# Patient Record
Sex: Male | Born: 1985
Health system: Southern US, Community
[De-identification: ages and names within clinical notes are randomized; demographics above are authoritative.]

## PROBLEM LIST (undated history)

## (undated) DIAGNOSIS — F419 Anxiety disorder, unspecified: Secondary | ICD-10-CM

## (undated) DIAGNOSIS — F410 Panic disorder [episodic paroxysmal anxiety] without agoraphobia: Secondary | ICD-10-CM

## (undated) DIAGNOSIS — Z8619 Personal history of other infectious and parasitic diseases: Secondary | ICD-10-CM

## (undated) DIAGNOSIS — K219 Gastro-esophageal reflux disease without esophagitis: Secondary | ICD-10-CM

## (undated) DIAGNOSIS — K589 Irritable bowel syndrome without diarrhea: Secondary | ICD-10-CM

## (undated) HISTORY — DX: Personal history of other infectious and parasitic diseases: Z86.19

## (undated) HISTORY — DX: Gastro-esophageal reflux disease without esophagitis: K21.9

---
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
## 2007-08-10 ENCOUNTER — Emergency Department (HOSPITAL_COMMUNITY): Admission: EM | Admit: 2007-08-10 | Discharge: 2007-08-10 | Payer: Self-pay | Admitting: Emergency Medicine

## 2011-09-03 LAB — DIFFERENTIAL
Lymphocytes Relative: 38
Lymphs Abs: 2.4
Neutro Abs: 3.2
Neutrophils Relative %: 52

## 2011-09-03 LAB — URINALYSIS, ROUTINE W REFLEX MICROSCOPIC
Nitrite: NEGATIVE
Specific Gravity, Urine: 1.013
Urobilinogen, UA: 0.2
pH: 6

## 2011-09-03 LAB — LIPASE, BLOOD: Lipase: 37

## 2011-09-03 LAB — COMPREHENSIVE METABOLIC PANEL
AST: 17
BUN: 20
CO2: 26
Calcium: 10
Creatinine, Ser: 1.08
GFR calc Af Amer: >60
GFR calc non Af Amer: >60
Glucose, Bld: 83

## 2011-09-03 LAB — CBC
HCT: 47
MCHC: 34.3
MCV: 85.9
RBC: 5.47

## 2013-11-21 ENCOUNTER — Encounter (HOSPITAL_BASED_OUTPATIENT_CLINIC_OR_DEPARTMENT_OTHER): Payer: Self-pay | Admitting: Emergency Medicine

## 2013-11-21 ENCOUNTER — Encounter: Payer: Self-pay | Admitting: Family Medicine

## 2013-11-21 ENCOUNTER — Emergency Department (HOSPITAL_BASED_OUTPATIENT_CLINIC_OR_DEPARTMENT_OTHER)
Admission: EM | Admit: 2013-11-21 | Discharge: 2013-11-21 | Disposition: A | Discharge status: Home / Self Care | Payer: BC Managed Care – PPO | Attending: Emergency Medicine | Admitting: Emergency Medicine

## 2013-11-21 DIAGNOSIS — R1011 Right upper quadrant pain: Secondary | ICD-10-CM | POA: Insufficient documentation

## 2013-11-21 DIAGNOSIS — K589 Irritable bowel syndrome without diarrhea: Secondary | ICD-10-CM | POA: Insufficient documentation

## 2013-11-21 DIAGNOSIS — R109 Unspecified abdominal pain: Secondary | ICD-10-CM

## 2013-11-21 DIAGNOSIS — Z79899 Other long term (current) drug therapy: Secondary | ICD-10-CM | POA: Insufficient documentation

## 2013-11-21 DIAGNOSIS — K219 Gastro-esophageal reflux disease without esophagitis: Secondary | ICD-10-CM | POA: Insufficient documentation

## 2013-11-21 HISTORY — DX: Gastro-esophageal reflux disease without esophagitis: K21.9

## 2013-11-21 HISTORY — DX: Irritable bowel syndrome, unspecified: K58.9

## 2013-11-21 LAB — POCT I-STAT, CHEM 8
BUN: 12 mg/dL (ref 6–23)
Creatinine, Ser: 1.3 mg/dL (ref 0.50–1.35)
Hemoglobin: 15.3 g/dL (ref 13.0–17.0)
Potassium: 4.3 mEq/L (ref 3.7–5.3)
Sodium: 141 mEq/L (ref 137–147)

## 2013-11-21 LAB — HEPATIC FUNCTION PANEL
AST: 13 U/L (ref 0–37)
Albumin: 4.1 g/dL (ref 3.5–5.2)

## 2013-11-21 MED ORDER — PANTOPRAZOLE SODIUM 40 MG PO TBEC
40.0000 mg | DELAYED_RELEASE_TABLET | Freq: Every day | ORAL | Status: DC
  Administered 2013-11-21: 40 mg via ORAL
  Filled 2013-11-21: qty 1

## 2013-11-21 MED ORDER — ONDANSETRON HCL 4 MG PO TABS: 4.0000 mg | ORAL_TABLET | Freq: Three times a day (TID) | ORAL | Status: DC | PRN

## 2013-11-21 MED ORDER — HYOSCYAMINE SULFATE 0.125 MG SL SUBL: 0.1250 mg | SUBLINGUAL_TABLET | SUBLINGUAL | Status: DC | PRN

## 2013-11-21 MED ORDER — PANTOPRAZOLE SODIUM 40 MG PO TBEC: 40.0000 mg | DELAYED_RELEASE_TABLET | Freq: Every day | ORAL | Status: DC

## 2013-11-21 NOTE — ED Notes (Addendum)
C/o n/v acid reflux-pt states he has been dealing with c/o "for years"-states he feels he needs different meds to control-has been followed by GI and has appt with new GI in [place] 12/19/13- last work up approx 3 weeks ago at [facility]

## 2013-11-21 NOTE — ED Provider Notes (Signed)
CSN: [id number]     Arrival date & time 11/21/13  1230 History   First MD Initiated Contact with Patient 11/21/13 1249     Chief Complaint  Patient presents with  . Nausea   (Consider location/radiation/quality/duration/timing/severity/associated sxs/prior Treatment) Patient is a 27 y.o. male presenting with abdominal pain. The history is provided by the patient. No language interpreter was used.  Abdominal Pain Associated symptoms: nausea   Associated symptoms: no chest pain, no chills, no diarrhea, no fever, no shortness of breath and no vomiting   Associated symptoms comment:  The patient states he has a history of 'significant' reflux disease and IBS, previously evaluated and managed by Dr. Vernell Barrier in [facility]. He has been on Reglan on a daily basis but denies any other medications currently or in the past. His symptoms are nausea, burning into the epigastrium and chest from reflux, diarrhea and abdominal cramping. His last exacerbation was 3 weeks ago when he was treated in the hospital with IV fluids and medications. Today he presents with nausea without vomiting, mild RUQ abdominal discomfort that has been persistent, mildly worse today, and no diarrhea. He denies fever at any time. He is following up where referred at [facility] in [place] with his initial appointment January 27th.    Past Medical History  Diagnosis Date  . GERD (gastroesophageal reflux disease)   . IBS (irritable bowel syndrome)    History reviewed. No pertinent past surgical history. No family history on file. History  Substance Use Topics  . Smoking status: Never Smoker   . Smokeless tobacco: Not on file  . Alcohol Use: No    Review of Systems  Constitutional: Negative for fever and chills.  Respiratory: Negative.  Negative for shortness of breath.   Cardiovascular: Negative.  Negative for chest pain.  Gastrointestinal: Positive for nausea and abdominal pain. Negative for vomiting and diarrhea.   Musculoskeletal: Negative.   Skin: Negative.  Negative for rash.  Neurological: Negative.     Allergies  Gluten meal  Home Medications   Current Outpatient Rx  Name  Route  Sig  Dispense  Refill  . dicyclomine (BENTYL) 10 MG capsule   Oral   Take 10 mg by mouth 2 (two) times daily.         . ondansetron (ZOFRAN) 4 MG tablet   Oral   Take 4 mg by mouth at bedtime.          BP 116/74  Pulse 76  Temp(Src) 97.9 F (36.6 C) (Oral)  Resp 16  Ht 5\' 6"  (1.676 m)  Wt 138 lb (62.596 kg)  BMI 22.28 kg/m2  SpO2 100% Physical Exam  Constitutional: He is oriented to person, place, and time. He appears well-developed and well-nourished.  HENT:  Head: Normocephalic.  Neck: Normal range of motion. Neck supple.  Cardiovascular: Normal rate and regular rhythm.   Pulmonary/Chest: Effort normal and breath sounds normal.  Abdominal: Soft. Bowel sounds are normal. There is tenderness. There is no rebound and no guarding.  Mild RUQ tenderness without hepatomegaly.  Musculoskeletal: Normal range of motion.  Neurological: He is alert and oriented to person, place, and time.  Skin: Skin is warm and dry. No rash noted.  Psychiatric: He has a normal mood and affect.    ED Course  Procedures (including critical care time) Labs Review Labs Reviewed  HEPATIC FUNCTION PANEL   Results for orders placed during the hospital encounter of 11/21/13  HEPATIC FUNCTION PANEL  Result Value Range   Total Protein 7.0  6.0 - 8.3 g/dL   Albumin 4.1  3.5 - 5.2 g/dL   AST 13  0 - 37 U/L   ALT 13  0 - 53 U/L   Alkaline Phosphatase 52  39 - 117 U/L   Total Bilirubin 1.7 (*) 0.3 - 1.2 mg/dL   Bilirubin, Direct 0.2  0.0 - 0.3 mg/dL   Indirect Bilirubin 1.5 (*) 0.3 - 0.9 mg/dL  POCT I-STAT, CHEM 8      Result Value Range   Sodium 141  137 - 147 mEq/L   Potassium 4.3  3.7 - 5.3 mEq/L   Chloride 102  96 - 112 mEq/L   BUN 12  6 - 23 mg/dL   Creatinine, Ser 4.09  0.50 - 1.35 mg/dL   Glucose, Bld  59 (*) 70 - 99 mg/dL   Calcium, Ion 8.11 (*) 1.12 - 1.23 mmol/L   TCO2 28  0 - 100 mmol/L   Hemoglobin 15.3  13.0 - 17.0 g/dL   HCT 91.4  78.2 - 95.6 %    Imaging Review No results found.  EKG Interpretation   None       MDM  No diagnosis found. 1. Abdominal pain  Protonix started for symptoms of reflux. Will switch Bentyl to levsin to see if it offers more relief than Bentyl, which he states is no longer effective for abdominal cramping. Continue Zofran for nausea. Keep scheduled appointment with [facility] later in January.    Arnoldo Hooker, PA-C 11/21/13 1659

## 2013-11-22 NOTE — ED Provider Notes (Signed)
Medical screening examination/treatment/procedure(s) were performed by non-physician practitioner and as supervising physician I was immediately available for consultation/collaboration.  EKG Interpretation   None         Charles B. Sheldon, MD 11/22/13 0704 

## 2013-11-24 ENCOUNTER — Other Ambulatory Visit: Payer: Self-pay | Admitting: General Practice

## 2013-12-19 DIAGNOSIS — K219 Gastro-esophageal reflux disease without esophagitis: Secondary | ICD-10-CM | POA: Insufficient documentation

## 2013-12-19 DIAGNOSIS — R109 Unspecified abdominal pain: Secondary | ICD-10-CM | POA: Insufficient documentation

## 2013-12-29 ENCOUNTER — Telehealth: Payer: Self-pay

## 2013-12-29 NOTE — Telephone Encounter (Signed)
Left message for call back. Identifiable.  Flu-DUE Tdap- 11/24/07 [facility]- 11/23/05-negative

## 2014-01-01 ENCOUNTER — Ambulatory Visit: Payer: Self-pay | Admitting: Family Medicine

## 2014-01-01 NOTE — Telephone Encounter (Signed)
Patient cancelled appt and rescheduled for 02/12/14 @ 0900.

## 2014-01-16 DIAGNOSIS — R110 Nausea: Secondary | ICD-10-CM | POA: Insufficient documentation

## 2014-02-09 ENCOUNTER — Telehealth: Payer: Self-pay

## 2014-02-09 NOTE — Telephone Encounter (Signed)
Left message for call back Identifiable  Flu- Td- 11/24/07 [facility]- 11/23/05-negative

## 2014-02-12 ENCOUNTER — Ambulatory Visit: Payer: Self-pay | Admitting: Family Medicine

## 2014-02-14 NOTE — Telephone Encounter (Signed)
Appointment was cancelled.

## 2014-04-13 ENCOUNTER — Other Ambulatory Visit: Payer: Self-pay | Admitting: General Practice

## 2014-04-13 MED ORDER — FLUCONAZOLE 150 MG PO TABS: ORAL_TABLET | ORAL | Status: DC

## 2014-06-22 ENCOUNTER — Other Ambulatory Visit: Payer: Self-pay | Admitting: General Practice

## 2014-06-22 MED ORDER — FLUCONAZOLE 150 MG PO TABS: ORAL_TABLET | ORAL | Status: DC

## 2014-07-04 ENCOUNTER — Other Ambulatory Visit: Payer: Self-pay | Admitting: General Practice

## 2014-07-04 ENCOUNTER — Ambulatory Visit (INDEPENDENT_AMBULATORY_CARE_PROVIDER_SITE_OTHER): Payer: BC Managed Care – PPO | Admitting: Family Medicine

## 2014-07-04 ENCOUNTER — Encounter: Payer: Self-pay | Admitting: Family Medicine

## 2014-07-04 VITALS — BP 112/78 | HR 70 | Temp 98.0°F | Resp 16 | Ht 65.5 in | Wt 137.4 lb

## 2014-07-04 DIAGNOSIS — F411 Generalized anxiety disorder: Secondary | ICD-10-CM | POA: Insufficient documentation

## 2014-07-04 DIAGNOSIS — B360 Pityriasis versicolor: Secondary | ICD-10-CM

## 2014-07-04 DIAGNOSIS — K219 Gastro-esophageal reflux disease without esophagitis: Secondary | ICD-10-CM

## 2014-07-04 MED ORDER — ALPRAZOLAM 0.5 MG PO TABS: 0.5000 mg | ORAL_TABLET | Freq: Two times a day (BID) | ORAL | Status: DC | PRN

## 2014-07-04 MED ORDER — PANTOPRAZOLE SODIUM 40 MG PO TBEC: 40.0000 mg | DELAYED_RELEASE_TABLET | Freq: Every day | ORAL | Status: DC

## 2014-07-04 MED ORDER — KETOCONAZOLE 2 % EX CREA: 1.0000 "application " | TOPICAL_CREAM | Freq: Every day | CUTANEOUS | Status: DC

## 2014-07-04 NOTE — Assessment & Plan Note (Signed)
New.  Pt was previously was prescribed Valium in October by GI.  Discussed w/ pt that benzo use prn is ok but that it should not be used regularly.  If pt is requiring regular use of xanax, will need daily controller med.

## 2014-07-04 NOTE — Progress Notes (Signed)
   Subjective:    Patient ID: Tanner Wallace, male    DOB: 22-Sep-1986, 28 y.o.   MRN: [id number]  HPI New to establish.  No regular PCP previously  Stomach issues- sxs started 'many years ago'.  Initially dx'd as IBS.  Developed into daily nausea, starting w/ waking in the AM, decreased appetite, vomiting 'pure stomach acid'.  Had lost considerable weight and was down to 110 lbs.  Was unable to work for 2-3 months as he had multiple tests done at [facility].  Now on Protonix.  Will still have intermittent 'bad days'.  Appetite has returned.  Pt has Zofran as needed for nausea- rarely taking.  Rarely requiring Levsin.  Tinea versicolor- triggered by sun exposure.  Was given Diflucan to take as needed- was told to take 1 tab weekly when present.  Pt had initially tried topical antifungal cream w/o improvement.   Review of Systems For ROS see HPI     Objective:   Physical Exam  Vitals reviewed. Constitutional: He is oriented to person, place, and time. He appears well-developed and well-nourished. No distress.  HENT:  Head: Normocephalic and atraumatic.  Eyes: Conjunctivae and EOM are normal. Pupils are equal, round, and reactive to light.  Neck: Normal range of motion. Neck supple. No thyromegaly present.  Cardiovascular: Normal rate, regular rhythm, normal heart sounds and intact distal pulses.   No murmur heard. Pulmonary/Chest: Effort normal and breath sounds normal. No respiratory distress.  Abdominal: Soft. Bowel sounds are normal. He exhibits no distension.  Musculoskeletal: He exhibits no edema.  Lymphadenopathy:    He has no cervical adenopathy.  Neurological: He is alert and oriented to person, place, and time. No cranial nerve deficit.  Skin: Skin is warm and dry.  Tinea versicolor on chest and upper arms  Psychiatric: He has a normal mood and affect. His behavior is normal.          Assessment & Plan:

## 2014-07-04 NOTE — Assessment & Plan Note (Signed)
New.  Pt taking Diflucan once weekly x3 weeks.  Will start topical antifungal to use along w/ oral tx to minimize use of antifungals long term.

## 2014-07-04 NOTE — Assessment & Plan Note (Signed)
New to provider, ongoing for pt.  Doing much better on Protonix.  Will continue daily.

## 2014-07-04 NOTE — Progress Notes (Signed)
Pre visit review using our clinic review tool, if applicable. No additional management support is needed unless otherwise documented below in the visit note. 

## 2014-07-04 NOTE — Patient Instructions (Signed)
Schedule your complete physical in 6 months Start applying the Ketoconazole cream daily x2 weeks in addition to the fluconazole Continue the Protonix daily Only use the Zofran/Levsin as needed If the anxiety/stress is really bad- use an alprazolam as needed.  This is only a rescue medication.  If you find yourself needing this regularly, we'll have to talk about changing to a daily controller medication Call with any questions or concerns Welcome!  We're glad to have you!

## 2014-09-03 ENCOUNTER — Telehealth: Payer: Self-pay | Admitting: General Practice

## 2014-09-03 MED ORDER — ALPRAZOLAM 0.5 MG PO TABS: 0.5000 mg | ORAL_TABLET | Freq: Two times a day (BID) | ORAL | Status: DC | PRN

## 2014-09-03 NOTE — Telephone Encounter (Signed)
Med filled faxed.  

## 2014-09-03 NOTE — Telephone Encounter (Signed)
Ok to refill 

## 2014-09-03 NOTE — Telephone Encounter (Signed)
Last OV 07-04-14 Alprazolam filled same day #30 with 0  Ok for refill, CPE scheduled for February.

## 2014-12-12 ENCOUNTER — Ambulatory Visit (INDEPENDENT_AMBULATORY_CARE_PROVIDER_SITE_OTHER): Payer: BLUE CROSS/BLUE SHIELD | Admitting: Family Medicine

## 2014-12-12 ENCOUNTER — Encounter: Payer: Self-pay | Admitting: Family Medicine

## 2014-12-12 VITALS — BP 116/80 | HR 69 | Temp 98.2°F | Resp 16 | Wt 147.4 lb

## 2014-12-12 DIAGNOSIS — B360 Pityriasis versicolor: Secondary | ICD-10-CM

## 2014-12-12 MED ORDER — FLUCONAZOLE 150 MG PO TABS: 300.0000 mg | ORAL_TABLET | ORAL | Status: DC

## 2014-12-12 NOTE — Progress Notes (Signed)
Pre visit review using our clinic review tool, if applicable. No additional management support is needed unless otherwise documented below in the visit note. 

## 2014-12-12 NOTE — Assessment & Plan Note (Signed)
Recurrent problem for pt.  Occuring in times of stress/heat/sweat.  No relief w/ topical tx.  Start oral anti-fungals.  Reviewed supportive care and red flags that should prompt return.  Pt expressed understanding and is in agreement w/ plan.

## 2014-12-12 NOTE — Patient Instructions (Signed)
Follow up as needed Start the Diflucan- 2 tabs at the same time (w/ food) x2 doses 1 week apart Call with any questions or concerns Hang in there!!!

## 2014-12-12 NOTE — Progress Notes (Signed)
   Subjective:    Patient ID: Orion ModestCory R Moncayo, male    DOB: 04-18-1986, 29 y.o.   MRN: [id number]  HPI Rash- pt reports sxs of similar.  Develops red spots on skin, typically on chest and shoulders, some on arms.  Typically appears w/ sweat and heat.  Itchy.  No oozing and drainage.  Has used Ketoconazole cream previously w/o relief.  Reports the only effective treatment last time was oral Diflucan.   Review of Systems For ROS see HPI     Objective:   Physical Exam  Constitutional: He is oriented to person, place, and time. He appears well-developed and well-nourished. No distress.  Neurological: He is alert and oriented to person, place, and time.  Skin: Skin is warm and dry. No rash (none present today which is frustrating for pt) noted. No erythema.  Psychiatric: He has a normal mood and affect. His behavior is normal. Thought content normal.  Vitals reviewed.         Assessment & Plan:

## 2015-01-03 ENCOUNTER — Telehealth: Payer: Self-pay | Admitting: *Deleted

## 2015-01-03 NOTE — Telephone Encounter (Signed)
Patient unavailable at time of Pre-Visit Call.  Health history up to date and patient declined flu vaccine at last visit.

## 2015-01-04 ENCOUNTER — Encounter: Payer: Self-pay | Admitting: Family Medicine

## 2015-01-04 ENCOUNTER — Ambulatory Visit (INDEPENDENT_AMBULATORY_CARE_PROVIDER_SITE_OTHER): Payer: BLUE CROSS/BLUE SHIELD | Admitting: Family Medicine

## 2015-01-04 VITALS — BP 118/80 | HR 103 | Temp 98.2°F | Resp 16 | Ht 66.0 in | Wt 150.0 lb

## 2015-01-04 DIAGNOSIS — Z0000 Encounter for general adult medical examination without abnormal findings: Secondary | ICD-10-CM | POA: Insufficient documentation

## 2015-01-04 LAB — HEPATIC FUNCTION PANEL
ALT: 15 U/L (ref 0–53)
AST: 14 U/L (ref 0–37)
Albumin: 4.3 g/dL (ref 3.5–5.2)
Alkaline Phosphatase: 51 U/L (ref 39–117)
Bilirubin, Direct: 0.2 mg/dL (ref 0.0–0.3)
Total Bilirubin: 1.2 mg/dL (ref 0.2–1.2)
Total Protein: 7 g/dL (ref 6.0–8.3)

## 2015-01-04 LAB — LIPID PANEL
Cholesterol: 149 mg/dL (ref 0–200)
HDL: 57.2 mg/dL
LDL Cholesterol: 82 mg/dL (ref 0–99)
NonHDL: 91.8
Total CHOL/HDL Ratio: 3
Triglycerides: 47 mg/dL (ref 0.0–149.0)
VLDL: 9.4 mg/dL (ref 0.0–40.0)

## 2015-01-04 LAB — BASIC METABOLIC PANEL
BUN: 17 mg/dL (ref 6–23)
CALCIUM: 9.4 mg/dL (ref 8.4–10.5)
CHLORIDE: 104 meq/L (ref 96–112)
CO2: 31 meq/L (ref 19–32)
CREATININE: 1.2 mg/dL (ref 0.40–1.50)
GFR: 77.25 mL/min (ref >60.00)
Glucose, Bld: 72 mg/dL (ref 70–99)
Potassium: 4.1 mEq/L (ref 3.5–5.1)
Sodium: 139 mEq/L (ref 135–145)

## 2015-01-04 LAB — CBC WITH DIFFERENTIAL/PLATELET
BASOS ABS: 0 10*3/uL (ref 0.0–0.1)
BASOS PCT: 0.4 % (ref 0.0–3.0)
Eosinophils Absolute: 0.1 10*3/uL (ref 0.0–0.7)
Eosinophils Relative: 1.7 % (ref 0.0–5.0)
HCT: 42.8 % (ref 39.0–52.0)
Hemoglobin: 14.7 g/dL (ref 13.0–17.0)
Lymphocytes Relative: 27.7 % (ref 12.0–46.0)
Lymphs Abs: 1.6 10*3/uL (ref 0.7–4.0)
MCHC: 34.3 g/dL (ref 30.0–36.0)
MCV: 89.3 fl (ref 78.0–100.0)
Monocytes Absolute: 0.5 10*3/uL (ref 0.1–1.0)
Monocytes Relative: 8.2 % (ref 3.0–12.0)
NEUTROS PCT: 62 % (ref 43.0–77.0)
Neutro Abs: 3.7 10*3/uL (ref 1.4–7.7)
PLATELETS: 199 10*3/uL (ref 150.0–400.0)
RBC: 4.79 Mil/uL (ref 4.22–5.81)
RDW: 13.7 % (ref 11.5–15.5)
WBC: 5.9 10*3/uL (ref 4.0–10.5)

## 2015-01-04 LAB — TSH: TSH: 0.88 u[IU]/mL (ref 0.35–4.50)

## 2015-01-04 NOTE — Patient Instructions (Signed)
Follow up in 1 year or as needed We'll notify you of your lab results and make any changes if needed Keep up the good work- you look great! Call with any questions or concerns Happy Early Birthday!

## 2015-01-04 NOTE — Progress Notes (Signed)
Pre visit review using our clinic review tool, if applicable. No additional management support is needed unless otherwise documented below in the visit note. 

## 2015-01-04 NOTE — Progress Notes (Signed)
   Subjective:    Patient ID: Tanner Wallace, male    DOB: 09/03/1986, 29 y.o.   MRN: [id number]  HPI CPE- no concerns today.   Review of Systems Patient reports no vision/hearing changes, anorexia, fever ,adenopathy, persistant/recurrent hoarseness, swallowing issues, chest pain, palpitations, edema, persistant/recurrent cough, hemoptysis, dyspnea (rest,exertional, paroxysmal nocturnal), gastrointestinal  bleeding (melena, rectal bleeding), abdominal pain, excessive heart burn, GU symptoms (dysuria, hematuria, voiding/incontinence issues) syncope, focal weakness, memory loss, numbness & tingling, skin/hair/nail changes, depression, anxiety, abnormal bruising/bleeding, musculoskeletal symptoms/signs.     Objective:   Physical Exam General Appearance:    Alert, cooperative, no distress, appears stated age  Head:    Normocephalic, without obvious abnormality, atraumatic  Eyes:    PERRL, conjunctiva/corneas clear, EOM's intact, fundi    benign, both eyes       Ears:    Normal TM's and external ear canals, both ears  Nose:   Nares normal, septum midline, mucosa normal, no drainage   or sinus tenderness  Throat:   Lips, mucosa, and tongue normal; teeth and gums normal  Neck:   Supple, symmetrical, trachea midline, no adenopathy;       thyroid:  No enlargement/tenderness/nodules  Back:     Symmetric, no curvature, ROM normal, no CVA tenderness  Lungs:     Clear to auscultation bilaterally, respirations unlabored  Chest wall:    No tenderness or deformity  Heart:    Regular rate and rhythm, S1 and S2 normal, no murmur, rub   or gallop  Abdomen:     Soft, non-tender, bowel sounds active all four quadrants,    no masses, no organomegaly  Genitalia:    Deferred at pt's request  Rectal:    Extremities:   Extremities normal, atraumatic, no cyanosis or edema  Pulses:   2+ and symmetric all extremities  Skin:   Skin color, texture, turgor normal, no rashes or lesions  Lymph nodes:   Cervical,  supraclavicular, and axillary nodes normal  Neurologic:   CNII-XII intact. Normal strength, sensation and reflexes      throughout          Assessment & Plan:

## 2015-01-04 NOTE — Assessment & Plan Note (Signed)
Pt's PE WNL.  Refused testicular exam.  Check labs.  Anticipatory guidance provided.

## 2015-01-07 ENCOUNTER — Encounter: Payer: Self-pay | Admitting: General Practice

## 2015-05-07 ENCOUNTER — Other Ambulatory Visit: Payer: Self-pay | Admitting: General Practice

## 2015-05-07 MED ORDER — KETOCONAZOLE 2 % EX CREA: 1.0000 "application " | TOPICAL_CREAM | Freq: Every day | CUTANEOUS | Status: DC

## 2015-06-03 ENCOUNTER — Encounter: Payer: Self-pay | Admitting: Family Medicine

## 2015-06-03 ENCOUNTER — Ambulatory Visit (INDEPENDENT_AMBULATORY_CARE_PROVIDER_SITE_OTHER): Payer: BLUE CROSS/BLUE SHIELD | Admitting: Family Medicine

## 2015-06-03 ENCOUNTER — Encounter: Payer: Self-pay | Admitting: General Practice

## 2015-06-03 VITALS — BP 120/82 | HR 73 | Temp 98.0°F | Resp 16 | Wt 156.4 lb

## 2015-06-03 DIAGNOSIS — K2990 Gastroduodenitis, unspecified, without bleeding: Secondary | ICD-10-CM | POA: Diagnosis not present

## 2015-06-03 DIAGNOSIS — B360 Pityriasis versicolor: Secondary | ICD-10-CM

## 2015-06-03 DIAGNOSIS — K2970 Gastritis, unspecified, without bleeding: Secondary | ICD-10-CM

## 2015-06-03 DIAGNOSIS — F411 Generalized anxiety disorder: Secondary | ICD-10-CM

## 2015-06-03 LAB — HEPATIC FUNCTION PANEL
ALBUMIN: 4.6 g/dL (ref 3.5–5.2)
ALK PHOS: 62 U/L (ref 39–117)
ALT: 20 U/L (ref 0–53)
AST: 16 U/L (ref 0–37)
Bilirubin, Direct: 0.3 mg/dL (ref 0.0–0.3)
Total Bilirubin: 2.4 mg/dL — ABNORMAL HIGH (ref 0.2–1.2)
Total Protein: 7.5 g/dL (ref 6.0–8.3)

## 2015-06-03 LAB — CBC WITH DIFFERENTIAL/PLATELET
Basophils Absolute: 0 10*3/uL (ref 0.0–0.1)
Basophils Relative: 0.3 % (ref 0.0–3.0)
EOS ABS: 0.1 10*3/uL (ref 0.0–0.7)
Eosinophils Relative: 1.4 % (ref 0.0–5.0)
HEMATOCRIT: 46 % (ref 39.0–52.0)
HEMOGLOBIN: 15.9 g/dL (ref 13.0–17.0)
LYMPHS PCT: 27.9 % (ref 12.0–46.0)
Lymphs Abs: 1.7 10*3/uL (ref 0.7–4.0)
MCHC: 34.5 g/dL (ref 30.0–36.0)
MCV: 90.1 fl (ref 78.0–100.0)
MONO ABS: 0.4 10*3/uL (ref 0.1–1.0)
MONOS PCT: 6.9 % (ref 3.0–12.0)
Neutro Abs: 3.8 10*3/uL (ref 1.4–7.7)
Neutrophils Relative %: 63.5 % (ref 43.0–77.0)
PLATELETS: 225 10*3/uL (ref 150.0–400.0)
RBC: 5.11 Mil/uL (ref 4.22–5.81)
RDW: 12.7 % (ref 11.5–15.5)
WBC: 6 10*3/uL (ref 4.0–10.5)

## 2015-06-03 LAB — BASIC METABOLIC PANEL
BUN: 16 mg/dL (ref 6–23)
CALCIUM: 9.7 mg/dL (ref 8.4–10.5)
CO2: 28 meq/L (ref 19–32)
Chloride: 101 mEq/L (ref 96–112)
Creatinine, Ser: 1.14 mg/dL (ref 0.40–1.50)
GFR: 81.71 mL/min (ref >60.00)
GLUCOSE: 83 mg/dL (ref 70–99)
Potassium: 4.1 mEq/L (ref 3.5–5.1)
Sodium: 136 mEq/L (ref 135–145)

## 2015-06-03 LAB — H. PYLORI ANTIBODY, IGG: H PYLORI IGG: NEGATIVE

## 2015-06-03 LAB — AMYLASE: Amylase: 52 U/L (ref 27–131)

## 2015-06-03 LAB — LIPASE: Lipase: 29 U/L (ref 11.0–59.0)

## 2015-06-03 MED ORDER — FLUCONAZOLE 150 MG PO TABS: ORAL_TABLET | ORAL | Status: DC

## 2015-06-03 MED ORDER — FLUOXETINE HCL 20 MG PO TABS: 20.0000 mg | ORAL_TABLET | Freq: Every day | ORAL | Status: AC

## 2015-06-03 MED ORDER — PANTOPRAZOLE SODIUM 40 MG PO TBEC: 40.0000 mg | DELAYED_RELEASE_TABLET | Freq: Two times a day (BID) | ORAL | Status: DC

## 2015-06-03 NOTE — Assessment & Plan Note (Signed)
Recurring problem for pt.  Resistant to topical tx.  Refill on Diflucan provided.

## 2015-06-03 NOTE — Assessment & Plan Note (Signed)
Deteriorated.  Pt is having high stress from work and family money issues.  Rarely using Alprazolam.  Needs daily controller medication to improve his sxs.  Pt is willing to attempt Prozac daily.  Will monitor closely for improvement.

## 2015-06-03 NOTE — Assessment & Plan Note (Signed)
New.  Pt continues to have daily AM nausea and some vomiting.  Now blood tinged.  Pt is refusing GI referral/endoscopy at this time.  Prefers to treat w/ BID PPI and dietary modifications.  Suspect that pt drinks more ETOH than he is reporting- cautioned him against this.  Cautioned him about risks of not seeing GI- including GI perforation/rupture, GI bleed.  Pt aware.  Will follow closely.

## 2015-06-03 NOTE — Patient Instructions (Signed)
Follow up in 1 month to recheck abdominal pain and stress We'll notify you of your lab results and make any changes if needed Start the Protonix twice daily x1 month and then daily LIMIT your alcohol intake as this is very irritating to the stomach lining Start the Prozac daily for stress Take the Diflucan as directed for rash If the abdominal pain/blood worsens- let me know so we can send you to GI Call with any questions or concerns Hang in there!

## 2015-06-03 NOTE — Progress Notes (Signed)
   Subjective:    Patient ID: Tanner Wallace, male    DOB: November 12, 1986, 29 y.o.   MRN: [id number]  HPI GERD- pt reports ongoing AM nausea but 'a couple of months ago' saw blood in his 'bile-acid' mixture.  Started OTC PPI w/ improvement for 'a couple of weeks'.  Last week again, noted blood when vomiting but 'more and more'.  Taking protonix 'only as needed'.  Also having pain in lower part of abdomen.  No blood in stool.  Denies dark, tarry stools.  Pain improves throughout the day.  Pain improves w/ eating.  Denies pain w/ ETOH.  Pt will drink 5-6 beers on Friday and Saturday nights.  Pt got to work at 12:30pm and left at 1:30pm due to abd pain.  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  On Wednesday 7/6 pt was unable to work b/c 'it was the worst pain of my life' and vomiting.  Tinea Versicolor- pt is again having rash, itching.  No relief w/ topical Nizoral.  Asking for refill on Diflucan   Review of Systems For ROS see HPI     Objective:   Physical Exam  Constitutional: He is oriented to person, place, and time. He appears well-developed and well-nourished. No distress.  HENT:  Head: Normocephalic and atraumatic.  Eyes: Conjunctivae and EOM are normal. Pupils are equal, round, and reactive to light.  Neck: Normal range of motion. Neck supple. No thyromegaly present.  Cardiovascular: Normal rate, regular rhythm, normal heart sounds and intact distal pulses.   No murmur heard. Pulmonary/Chest: Effort normal and breath sounds normal. No respiratory distress.  Abdominal: Soft. Bowel sounds are normal. He exhibits no distension.  Musculoskeletal: He exhibits no edema.  Lymphadenopathy:    He has no cervical adenopathy.  Neurological: He is alert and oriented to person, place, and time. No cranial nerve deficit.  Skin: Skin is warm and dry.  Psychiatric: He has a normal mood and affect. His behavior is normal.  Vitals reviewed.         Assessment &  Plan:

## 2015-06-03 NOTE — Progress Notes (Signed)
Pre visit review using our clinic review tool, if applicable. No additional management support is needed unless otherwise documented below in the visit note. 

## 2015-07-04 ENCOUNTER — Ambulatory Visit (INDEPENDENT_AMBULATORY_CARE_PROVIDER_SITE_OTHER): Payer: BLUE CROSS/BLUE SHIELD | Admitting: Family Medicine

## 2015-07-04 ENCOUNTER — Encounter: Payer: Self-pay | Admitting: Family Medicine

## 2015-07-04 VITALS — BP 122/80 | HR 70 | Temp 98.0°F | Resp 16 | Ht 66.0 in | Wt 158.0 lb

## 2015-07-04 DIAGNOSIS — F411 Generalized anxiety disorder: Secondary | ICD-10-CM

## 2015-07-04 DIAGNOSIS — K2990 Gastroduodenitis, unspecified, without bleeding: Secondary | ICD-10-CM | POA: Diagnosis not present

## 2015-07-04 DIAGNOSIS — K219 Gastro-esophageal reflux disease without esophagitis: Secondary | ICD-10-CM | POA: Diagnosis not present

## 2015-07-04 DIAGNOSIS — S39011A Strain of muscle, fascia and tendon of abdomen, initial encounter: Secondary | ICD-10-CM | POA: Diagnosis not present

## 2015-07-04 DIAGNOSIS — K2970 Gastritis, unspecified, without bleeding: Secondary | ICD-10-CM | POA: Diagnosis not present

## 2015-07-04 MED ORDER — TRAMADOL HCL 50 MG PO TABS
50.0000 mg | ORAL_TABLET | Freq: Three times a day (TID) | ORAL | Status: DC | PRN
Start: 2015-07-04 — End: 2016-06-02

## 2015-07-04 MED ORDER — CYCLOBENZAPRINE HCL 10 MG PO TABS: 10.0000 mg | ORAL_TABLET | Freq: Every day | ORAL | Status: AC

## 2015-07-04 MED ORDER — SUCRALFATE 1 G PO TABS: 1.0000 g | ORAL_TABLET | Freq: Three times a day (TID) | ORAL | Status: DC

## 2015-07-04 NOTE — Progress Notes (Signed)
Pre visit review using our clinic review tool, if applicable. No additional management support is needed unless otherwise documented below in the visit note. 

## 2015-07-04 NOTE — Assessment & Plan Note (Signed)
New.  Pt w/ an apparent R oblique strain.  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  Will start Tylenol for daytime pain, Ultram prn, and flexeril at night.  No NSAIDs due to severe gastritis.  Encouraged abdominal binder when working.  Reviewed supportive care and red flags that should prompt return.  Pt expressed understanding and is in agreement w/ plan.

## 2015-07-04 NOTE — Patient Instructions (Signed)
Follow up in 6 weeks to reassess GI issues Continue the Protonix twice daily ADD the Carafate prior to eating to protect your stomach Continue the Fluoxetine daily for anxiety- this is doing better! Try and limit twisting or lifting to improve your abdominal strain Use the flexeril prior to bed- will cause drowsiness Use the Tramadol as needed for severe pain Tylenol 2-3x/day as needed for abd strain Wear a binder to support the abs Alternate heat/ice for pain relief Call with any questions or concerns Hang in there!!!

## 2015-07-04 NOTE — Progress Notes (Signed)
   Subjective:    Patient ID: Tanner Wallace, male    DOB: 1986/08/04, 29 y.o.   MRN: [id number]  HPI Anxiety- started Prozac at last visit.  Pt reports anxiety is improving, feeling less wound up.  No longer shaking due to stress level.  Sleeping better.  GERD- pt reports he is no longer having bloody emesis in the AM.  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  No known injury.  Pt plays softball.  Denies bruising, swelling.  Pain w/ twisting or pulling motion.   Review of Systems For ROS see HPI     Objective:   Physical Exam  Constitutional: He is oriented to person, place, and time. He appears well-developed and well-nourished. No distress.  HENT:  Head: Normocephalic and atraumatic.  Pulmonary/Chest: Effort normal and breath sounds normal. No respiratory distress. He has no wheezes. He has no rales.  Abdominal: Soft. Bowel sounds are normal. He exhibits no distension. There is tenderness (TTP over R oblique). There is no rebound and no guarding.  Musculoskeletal: He exhibits tenderness (TTP over R oblique). He exhibits no edema.  Neurological: He is alert and oriented to person, place, and time.  Skin: Skin is warm and dry.  No bruising noted over R flank  Psychiatric:  Less anxious than previous  Vitals reviewed.         Assessment & Plan:

## 2015-07-04 NOTE — Assessment & Plan Note (Signed)
Improving since starting Protonix BID.  Reviewed lifestyle and dietary modifications.  Will follow.

## 2015-07-04 NOTE — Assessment & Plan Note (Signed)
Improving on Protonix twice daily.  Add Carafate to speed healing.  Pt declines GI referral at this time due to cost.  Will follow.

## 2015-07-04 NOTE — Assessment & Plan Note (Signed)
Improved since starting Prozac.  Will hold dose at current level and continue to follow, titrating prn.

## 2015-07-22 ENCOUNTER — Other Ambulatory Visit: Payer: Self-pay | Admitting: General Practice

## 2015-07-22 MED ORDER — FLUCONAZOLE 150 MG PO TABS: ORAL_TABLET | ORAL | Status: DC

## 2015-08-15 ENCOUNTER — Encounter: Payer: Self-pay | Admitting: Family Medicine

## 2015-08-15 ENCOUNTER — Ambulatory Visit (INDEPENDENT_AMBULATORY_CARE_PROVIDER_SITE_OTHER): Payer: BLUE CROSS/BLUE SHIELD | Admitting: Family Medicine

## 2015-08-15 VITALS — BP 120/78 | HR 61 | Temp 98.0°F | Resp 16 | Ht 66.0 in | Wt 165.1 lb

## 2015-08-15 DIAGNOSIS — F411 Generalized anxiety disorder: Secondary | ICD-10-CM

## 2015-08-15 DIAGNOSIS — K2990 Gastroduodenitis, unspecified, without bleeding: Secondary | ICD-10-CM

## 2015-08-15 DIAGNOSIS — K2970 Gastritis, unspecified, without bleeding: Secondary | ICD-10-CM | POA: Diagnosis not present

## 2015-08-15 NOTE — Assessment & Plan Note (Signed)
Improved.  Pt doing well on Prozac daily.  No med changes at this time.  Will continue to follow.

## 2015-08-15 NOTE — Progress Notes (Signed)
   Subjective:    Patient ID: Tanner Wallace, male    DOB: 1986-07-29, 29 y.o.   MRN: [id number]  HPI abd pain- started Carafate at last visit, pt reports sxs are much improved.  Still taking Protonix twice daily.  No longer vomiting in the AM.  No blood.  Anxiety- pt reports sxs are 'pretty good'.  'it's not nearly as bad as it was'.  No longer shaking.  Pt is not interested in increasing dose.   Review of Systems For ROS see HPI     Objective:   Physical Exam  Constitutional: He is oriented to person, place, and time. He appears well-developed and well-nourished. No distress.  HENT:  Head: Normocephalic and atraumatic.  Eyes: Conjunctivae and EOM are normal. Pupils are equal, round, and reactive to light.  Neck: Normal range of motion. Neck supple. No thyromegaly present.  Cardiovascular: Normal rate, regular rhythm, normal heart sounds and intact distal pulses.   No murmur heard. Pulmonary/Chest: Effort normal and breath sounds normal. No respiratory distress.  Abdominal: Soft. Bowel sounds are normal. He exhibits no distension.  Musculoskeletal: He exhibits no edema.  Lymphadenopathy:    He has no cervical adenopathy.  Neurological: He is alert and oriented to person, place, and time. No cranial nerve deficit.  Skin: Skin is warm and dry.  Psychiatric: He has a normal mood and affect. His behavior is normal.  Vitals reviewed.         Assessment & Plan:

## 2015-08-15 NOTE — Assessment & Plan Note (Signed)
Improving.  Pt reports sxs are much better since starting Carafate 5 weeks ago.  At this time, will have him stop Carafate and see if we can maintain on the Protonix twice daily.  If sxs return, will need GI referral.  Reviewed supportive care and red flags that should prompt return.  Pt expressed understanding and is in agreement w/ plan.

## 2015-08-15 NOTE — Patient Instructions (Signed)
Follow up by phone/MyChart in 1-2 weeks and let me know how the stomach is doing STOP the carafate Continue the Protonix twice daily Continue the Fluoxetine (Prozac) once daily Call with any questions or concerns Happy Fall!!!

## 2015-08-15 NOTE — Progress Notes (Signed)
Pre visit review using our clinic review tool, if applicable. No additional management support is needed unless otherwise documented below in the visit note. 

## 2016-01-06 ENCOUNTER — Ambulatory Visit (INDEPENDENT_AMBULATORY_CARE_PROVIDER_SITE_OTHER): Payer: BLUE CROSS/BLUE SHIELD | Admitting: Family Medicine

## 2016-01-06 ENCOUNTER — Telehealth: Payer: Self-pay | Admitting: *Deleted

## 2016-01-06 ENCOUNTER — Encounter: Payer: Self-pay | Admitting: Family Medicine

## 2016-01-06 VITALS — BP 122/80 | HR 74 | Temp 97.9°F | Ht 66.0 in | Wt 172.2 lb

## 2016-01-06 DIAGNOSIS — R110 Nausea: Secondary | ICD-10-CM

## 2016-01-06 DIAGNOSIS — R5383 Other fatigue: Secondary | ICD-10-CM | POA: Diagnosis not present

## 2016-01-06 DIAGNOSIS — K920 Hematemesis: Secondary | ICD-10-CM | POA: Diagnosis not present

## 2016-01-06 DIAGNOSIS — K2990 Gastroduodenitis, unspecified, without bleeding: Secondary | ICD-10-CM | POA: Diagnosis not present

## 2016-01-06 DIAGNOSIS — K2970 Gastritis, unspecified, without bleeding: Secondary | ICD-10-CM

## 2016-01-06 LAB — HEPATIC FUNCTION PANEL
ALBUMIN: 4.5 g/dL (ref 3.5–5.2)
ALT: 24 U/L (ref 0–53)
AST: 16 U/L (ref 0–37)
Alkaline Phosphatase: 57 U/L (ref 39–117)
BILIRUBIN DIRECT: 0.1 mg/dL (ref 0.0–0.3)
TOTAL PROTEIN: 7.3 g/dL (ref 6.0–8.3)
Total Bilirubin: 1 mg/dL (ref 0.2–1.2)

## 2016-01-06 LAB — CBC WITH DIFFERENTIAL/PLATELET
BASOS ABS: 0 10*3/uL (ref 0.0–0.1)
Basophils Relative: 0.4 % (ref 0.0–3.0)
EOS ABS: 0.1 10*3/uL (ref 0.0–0.7)
Eosinophils Relative: 2.4 % (ref 0.0–5.0)
HEMATOCRIT: 44.3 % (ref 39.0–52.0)
HEMOGLOBIN: 15 g/dL (ref 13.0–17.0)
LYMPHS PCT: 33 % (ref 12.0–46.0)
Lymphs Abs: 2 10*3/uL (ref 0.7–4.0)
MCHC: 33.8 g/dL (ref 30.0–36.0)
MCV: 89.3 fl (ref 78.0–100.0)
MONO ABS: 0.5 10*3/uL (ref 0.1–1.0)
Monocytes Relative: 8.4 % (ref 3.0–12.0)
Neutro Abs: 3.4 10*3/uL (ref 1.4–7.7)
Neutrophils Relative %: 55.8 % (ref 43.0–77.0)
Platelets: 220 10*3/uL (ref 150.0–400.0)
RBC: 4.96 Mil/uL (ref 4.22–5.81)
RDW: 13.1 % (ref 11.5–15.5)
WBC: 6.2 10*3/uL (ref 4.0–10.5)

## 2016-01-06 LAB — H. PYLORI ANTIBODY, IGG: H PYLORI IGG: NEGATIVE

## 2016-01-06 LAB — BASIC METABOLIC PANEL
BUN: 18 mg/dL (ref 6–23)
CHLORIDE: 105 meq/L (ref 96–112)
CO2: 30 mEq/L (ref 19–32)
Calcium: 9.7 mg/dL (ref 8.4–10.5)
Creatinine, Ser: 1.21 mg/dL (ref 0.40–1.50)
GFR: 75.94 mL/min (ref >60.00)
Glucose, Bld: 82 mg/dL (ref 70–99)
POTASSIUM: 4 meq/L (ref 3.5–5.1)
SODIUM: 140 meq/L (ref 135–145)

## 2016-01-06 LAB — TSH: TSH: 1.89 u[IU]/mL (ref 0.35–4.50)

## 2016-01-06 LAB — B12 AND FOLATE PANEL
FOLATE: 6.9 ng/mL (ref >5.9)
VITAMIN B 12: 305 pg/mL (ref 211–911)

## 2016-01-06 MED ORDER — TRAZODONE HCL 50 MG PO TABS
25.0000 mg | ORAL_TABLET | Freq: Every evening | ORAL | Status: DC | PRN
Start: 2016-01-06 — End: 2016-06-02

## 2016-01-06 MED ORDER — SUCRALFATE 1 GM/10ML PO SUSP: 1.0000 g | Freq: Three times a day (TID) | ORAL | Status: DC

## 2016-01-06 NOTE — Assessment & Plan Note (Signed)
New.  Pt reports having very low energy and difficulty getting going in the AM.  This could directly relate to his poor sleep but must also r/o anemia is setting of hematemesis, thyroid abnormality, or electrolyte disturbance.  Will check labs and correct abnormalities if they arise.

## 2016-01-06 NOTE — Progress Notes (Signed)
   Subjective:    Patient ID: Tanner Wallace, male    DOB: 06/16/1986, 30 y.o.   MRN: [id number]  HPI Gastritis- recurring problem for pt.  Reports that sxs were well controlled until ~1 month ago when he started vomiting again each morning.  2 weeks ago noticed blood in vomiting.  Pt reports increased AM fatigue after vomiting- 'takes 1/2 a day to get my energy back'.  Pt is on daily PPI and carafate twice daily (lunch and dinner).  Insomnia- pt reports he has always had trouble falling asleep but lately he is having difficulty staying asleep.  Asking for medication to help with this.   Review of Systems For ROS see HPI     Objective:   Physical Exam  Constitutional: He is oriented to person, place, and time. He appears well-developed and well-nourished. No distress.  HENT:  Head: Normocephalic and atraumatic.  Eyes: Conjunctivae and EOM are normal. Pupils are equal, round, and reactive to light.  Cardiovascular: Normal rate, regular rhythm and normal heart sounds.   Pulmonary/Chest: Breath sounds normal. No respiratory distress. He has no wheezes. He has no rales.  Abdominal: Soft. Bowel sounds are normal. He exhibits no distension. There is no tenderness. There is no rebound.  Neurological: He is alert and oriented to person, place, and time.  Skin: Skin is warm and dry.  Psychiatric: He has a normal mood and affect. His behavior is normal. Thought content normal.  Vitals reviewed.         Assessment & Plan:

## 2016-01-06 NOTE — Assessment & Plan Note (Signed)
New.  Suspect that this is due to pt's gastritis/esophagitis but strongly encouraged him to see GI.  Pt declines.  Reviewed the risks- including major GI bleed- but pt is not interested at this time.  Prefers medical management.  Will follow closely.

## 2016-01-06 NOTE — Progress Notes (Signed)
Pre visit review using our clinic review tool, if applicable. No additional management support is needed unless otherwise documented below in the visit note. 

## 2016-01-06 NOTE — Telephone Encounter (Signed)
Received PA for Carafate from [facility] 02/13

## 2016-01-06 NOTE — Assessment & Plan Note (Signed)
Deteriorated.  Pt is again having AM vomiting and now there is blood present.  Stressed need for GI referral- pt declines at this time.  Prefers to double PPI and switch to liquid carafate.  Check H pylori and tx prn.  If no improvement in vomiting or hematemesis pt will need GI.  Pt expressed understanding and is in agreement w/ plan.

## 2016-01-06 NOTE — Patient Instructions (Signed)
Follow up by phone in 2 weeks to let me know about the vomiting/bleeding Increase the Protonix to twice daily to decrease acid production Continue the Carafate but increase to 3x/day- lunch, dinner, before bed Avoid spicy, acidic, caffeine, alcohol- this will worsen stomach upset Call with any questions or concerns- particularly if worsening or not improving b/c we will need GI referral Hang in there!!!

## 2016-01-07 ENCOUNTER — Telehealth: Payer: Self-pay | Admitting: Behavioral Health

## 2016-01-07 NOTE — Telephone Encounter (Signed)
Pre-Visit Call completed with patient and chart updated.   Pre-Visit Info documented in Specialty Comments under SnapShot.    

## 2016-01-08 ENCOUNTER — Encounter: Payer: Self-pay | Admitting: Family Medicine

## 2016-01-08 ENCOUNTER — Ambulatory Visit (INDEPENDENT_AMBULATORY_CARE_PROVIDER_SITE_OTHER): Payer: BLUE CROSS/BLUE SHIELD | Admitting: Family Medicine

## 2016-01-08 VITALS — BP 120/84 | HR 65 | Temp 97.8°F | Ht 66.0 in | Wt 172.6 lb

## 2016-01-08 DIAGNOSIS — Z0000 Encounter for general adult medical examination without abnormal findings: Secondary | ICD-10-CM

## 2016-01-08 MED ORDER — RANITIDINE HCL 300 MG PO TABS: 300.0000 mg | ORAL_TABLET | Freq: Every day | ORAL | Status: DC

## 2016-01-08 NOTE — Progress Notes (Signed)
Pre visit review using our clinic review tool, if applicable. No additional management support is needed unless otherwise documented below in the visit note. 

## 2016-01-08 NOTE — Progress Notes (Signed)
   Subjective:    Patient ID: Tanner Wallace, male    DOB: 09/12/1986, 30 y.o.   MRN: [id number]  HPI CPE- no new concerns today.   Review of Systems Patient reports no vision/hearing changes, anorexia, fever ,adenopathy, persistant/recurrent hoarseness, swallowing issues, chest pain, palpitations, edema, persistant/recurrent cough, hemoptysis, dyspnea (rest,exertional, paroxysmal nocturnal), gastrointestinal  bleeding (melena, rectal bleeding), GU symptoms (dysuria, hematuria, voiding/incontinence issues) syncope, focal weakness, memory loss, numbness & tingling, skin/hair/nail changes, depression, anxiety, abnormal bruising, musculoskeletal symptoms/signs.     Objective:   Physical Exam General Appearance:    Alert, cooperative, no distress, appears stated age  Head:    Normocephalic, without obvious abnormality, atraumatic  Eyes:    PERRL, conjunctiva/corneas clear, EOM's intact, fundi    benign, both eyes       Ears:    Normal TM's and external ear canals, both ears  Nose:   Nares normal, septum midline, mucosa normal, no drainage   or sinus tenderness  Throat:   Lips, mucosa, and tongue normal; teeth and gums normal  Neck:   Supple, symmetrical, trachea midline, no adenopathy;       thyroid:  No enlargement/tenderness/nodules  Back:     Symmetric, no curvature, ROM normal, no CVA tenderness  Lungs:     Clear to auscultation bilaterally, respirations unlabored  Chest wall:    No tenderness or deformity  Heart:    Regular rate and rhythm, S1 and S2 normal, no murmur, rub   or gallop  Abdomen:     Soft, non-tender, bowel sounds active all four quadrants,    no masses, no organomegaly  Genitalia:    Deferred at pt's request  Rectal:    Extremities:   Extremities normal, atraumatic, no cyanosis or edema  Pulses:   2+ and symmetric all extremities  Skin:   Skin color, texture, turgor normal, no rashes or lesions  Lymph nodes:   Cervical, supraclavicular, and axillary nodes normal    Neurologic:   CNII-XII intact. Normal strength, sensation and reflexes      throughout          Assessment & Plan:

## 2016-01-08 NOTE — Patient Instructions (Signed)
Follow up by phone or MyChart in 2 weeks to let me know how you're feeling Your labs from the other day look great!  No changes at this time Continue the protonix twice daily Add the Ranitidine nightly Continue the Carafate Call with any questions or concerns Hang in there!!

## 2016-01-08 NOTE — Assessment & Plan Note (Signed)
Pt's PE WNL.  Labs done 2 days ago- reviewed w/ pt.  No changes at this time.  Discussed importance of healthy diet and regular exercise.  Will follow.

## 2016-01-13 ENCOUNTER — Telehealth: Payer: Self-pay | Admitting: General Practice

## 2016-01-13 NOTE — Telephone Encounter (Signed)
PA for carafate began today. PA approved Effective from 01/13/2016 through 11/22/2038.

## 2016-01-13 NOTE — Telephone Encounter (Signed)
PA began for carafate with covermymeds

## 2016-03-11 ENCOUNTER — Other Ambulatory Visit: Payer: Self-pay | Admitting: General Practice

## 2016-03-11 MED ORDER — FLUCONAZOLE 150 MG PO TABS: ORAL_TABLET | ORAL | Status: DC

## 2016-06-02 ENCOUNTER — Encounter: Payer: Self-pay | Admitting: Physician Assistant

## 2016-06-02 ENCOUNTER — Ambulatory Visit (INDEPENDENT_AMBULATORY_CARE_PROVIDER_SITE_OTHER): Payer: BLUE CROSS/BLUE SHIELD | Admitting: Physician Assistant

## 2016-06-02 VITALS — BP 114/68 | HR 72 | Temp 98.2°F | Resp 16 | Ht 66.0 in | Wt 172.0 lb

## 2016-06-02 DIAGNOSIS — K219 Gastro-esophageal reflux disease without esophagitis: Secondary | ICD-10-CM

## 2016-06-02 MED ORDER — DICYCLOMINE HCL 10 MG PO CAPS
10.0000 mg | ORAL_CAPSULE | Freq: Three times a day (TID) | ORAL | Status: DC
Start: 2016-06-02 — End: 2022-01-31

## 2016-06-02 MED ORDER — DEXLANSOPRAZOLE 60 MG PO CPDR: 60.0000 mg | DELAYED_RELEASE_CAPSULE | Freq: Every day | ORAL | Status: DC

## 2016-06-02 MED ORDER — TRAZODONE HCL 50 MG PO TABS: 25.0000 mg | ORAL_TABLET | Freq: Every evening | ORAL | Status: AC | PRN

## 2016-06-02 NOTE — Patient Instructions (Signed)
Please stop the Protonix and Start the Dexilant 60 mg daily. Continue the Ranitidine and begin a daily probiotic (I recommend Align). Eat a bland diet. Limit alcohol intake. Avoid late night eating.  Restart the Trazodone nightly for sleep and to help with stress levels. This should also help with reflux.  If symptoms not improving we will need to get you back in with Gastroenterology.

## 2016-06-03 ENCOUNTER — Telehealth: Payer: Self-pay | Admitting: *Deleted

## 2016-06-03 MED FILL — DEXILANT DR 60 MG CAPSULE: 60 | 30 days supply | Qty: 30 | Fill #0

## 2016-06-03 NOTE — Telephone Encounter (Signed)
PA initiated on [email or web address] Key: [id number] - Rx #: [id number]  Awaiting determination

## 2016-06-05 NOTE — Telephone Encounter (Signed)
PA approved effective from 06/03/2016 through 11/22/2038

## 2016-06-07 NOTE — Progress Notes (Signed)
    Patient presents to clinic today c/o continued significant acid reflux symptoms including heart burn and indigestion. Has significant history of this, trying several medications in the past that have been sub-therapeutic. Is currently on Protonix 40 mg BID. Endorses this medication worked for a while but is no longer controlling symptoms. Denies chest pain, palpitations or SOB. Is affecting his sleep at night. Previousy followed by GI. No current follow-up.    Past Medical History  Diagnosis Date  . GERD (gastroesophageal reflux disease)   . IBS (irritable bowel syndrome)   . History of chicken pox   . Acid reflux     Current Outpatient Prescriptions on File Prior to Visit  Medication Sig Dispense Refill  . ALPRAZolam (XANAX) 0.5 MG tablet Take 1 tablet (0.5 mg total) by mouth 2 (two) times daily as needed for anxiety. 30 tablet 1  . cyclobenzaprine (FLEXERIL) 10 MG tablet Take 1 tablet (10 mg total) by mouth at bedtime. 30 tablet 0  . FLUoxetine (PROZAC) 20 MG tablet Take 1 tablet (20 mg total) by mouth daily. 30 tablet 3  . ranitidine (ZANTAC) 300 MG tablet Take 1 tablet (300 mg total) by mouth at bedtime. 30 tablet 6  . sucralfate (CARAFATE) 1 GM/10ML suspension Take 10 mLs (1 g total) by mouth 4 (four) times daily -  with meals and at bedtime. (Patient taking differently: Take 1 g by mouth 4 (four) times daily as needed. ) 420 mL 3   No current facility-administered medications on file prior to visit.    No Known Allergies  No family history on file.  Social History   Social History  . Marital Status: Single    Spouse Name: N/A  . Number of Children: N/A  . Years of Education: N/A   Social History Main Topics  . Smoking status: Never Smoker   . Smokeless tobacco: Never Used  . Alcohol Use: Yes  . Drug Use: No  . Sexual Activity: Yes   Other Topics Concern  . None   Social History Narrative    Review of Systems - See HPI.  All other ROS are negative.  BP  114/68 mmHg  Pulse 72  Temp(Src) 98.2 F (36.8 C) (Oral)  Resp 16  Ht 5\' 6"  (1.676 m)  Wt 172 lb (78.019 kg)  BMI 27.77 kg/m2  SpO2 99%  Physical Exam  Constitutional: He is oriented to person, place, and time and well-developed, well-nourished, and in no distress.  HENT:  Head: Normocephalic and atraumatic.  Eyes: Conjunctivae are normal.  Neck: Neck supple.  Cardiovascular: Normal rate, regular rhythm, normal heart sounds and intact distal pulses.   Pulmonary/Chest: Effort normal and breath sounds normal. No respiratory distress. He has no wheezes. He has no rales. He exhibits no tenderness.  Abdominal: Soft. Bowel sounds are normal. He exhibits no distension and no mass. There is no tenderness. There is no rebound and no guarding.  Neurological: He is alert and oriented to person, place, and time.  Skin: Skin is warm and dry. No rash noted.  Psychiatric: Affect normal.  Vitals reviewed.  Assessment/Plan: 1. Gastroesophageal reflux disease, esophagitis presence not specified Will change Protonix for trial of Dexilant. Continue Ranitidine as directed. Supportive measures and GERD diet discussed. Patient to restart Trazodone for sleep and nighttime GERD symptoms. FU with PCP as scheduled. Recommend repeat assessment with GI.    Piedad ClimesMartin, Chloee Tena Cody, PA-C

## 2016-06-08 ENCOUNTER — Other Ambulatory Visit: Payer: Self-pay | Admitting: Family Medicine

## 2016-06-08 NOTE — Telephone Encounter (Signed)
Med denied, pt was changed to dexilant.

## 2016-10-14 ENCOUNTER — Ambulatory Visit (INDEPENDENT_AMBULATORY_CARE_PROVIDER_SITE_OTHER): Payer: BLUE CROSS/BLUE SHIELD | Admitting: Physician Assistant

## 2016-10-14 ENCOUNTER — Encounter: Payer: Self-pay | Admitting: Physician Assistant

## 2016-10-14 VITALS — BP 114/78 | HR 70 | Temp 98.5°F | Resp 16 | Ht 66.0 in | Wt 170.0 lb

## 2016-10-14 DIAGNOSIS — M2550 Pain in unspecified joint: Secondary | ICD-10-CM

## 2016-10-14 DIAGNOSIS — R5383 Other fatigue: Secondary | ICD-10-CM | POA: Diagnosis not present

## 2016-10-14 DIAGNOSIS — B360 Pityriasis versicolor: Secondary | ICD-10-CM

## 2016-10-14 DIAGNOSIS — M25561 Pain in right knee: Secondary | ICD-10-CM | POA: Diagnosis not present

## 2016-10-14 DIAGNOSIS — K219 Gastro-esophageal reflux disease without esophagitis: Secondary | ICD-10-CM

## 2016-10-14 DIAGNOSIS — G8929 Other chronic pain: Secondary | ICD-10-CM

## 2016-10-14 DIAGNOSIS — M25562 Pain in left knee: Secondary | ICD-10-CM

## 2016-10-14 LAB — BASIC METABOLIC PANEL
BUN: 17 mg/dL (ref 6–23)
CHLORIDE: 101 meq/L (ref 96–112)
CO2: 29 mEq/L (ref 19–32)
CREATININE: 1.18 mg/dL (ref 0.40–1.50)
Calcium: 9.9 mg/dL (ref 8.4–10.5)
GFR: 77.75 mL/min (ref >60.00)
Glucose, Bld: 89 mg/dL (ref 70–99)
POTASSIUM: 4.6 meq/L (ref 3.5–5.1)
SODIUM: 139 meq/L (ref 135–145)

## 2016-10-14 LAB — CBC
HCT: 45.5 % (ref 39.0–52.0)
Hemoglobin: 15.7 g/dL (ref 13.0–17.0)
MCHC: 34.5 g/dL (ref 30.0–36.0)
MCV: 86.8 fl (ref 78.0–100.0)
Platelets: 242 10*3/uL (ref 150.0–400.0)
RBC: 5.23 Mil/uL (ref 4.22–5.81)
RDW: 13.4 % (ref 11.5–15.5)
WBC: 6.8 10*3/uL (ref 4.0–10.5)

## 2016-10-14 LAB — SEDIMENTATION RATE: SED RATE: 3 mm/h (ref 0–15)

## 2016-10-14 LAB — TSH: TSH: 0.91 u[IU]/mL (ref 0.35–4.50)

## 2016-10-14 MED ORDER — FAMOTIDINE 40 MG PO TABS: 40.0000 mg | ORAL_TABLET | Freq: Every day | ORAL | 0 refills | Status: DC

## 2016-10-14 MED ORDER — CELECOXIB 100 MG PO CAPS: 100.0000 mg | ORAL_CAPSULE | Freq: Every day | ORAL | 0 refills | Status: DC

## 2016-10-14 MED ORDER — FLUCONAZOLE 150 MG PO TABS: ORAL_TABLET | ORAL | 2 refills | Status: DC

## 2016-10-14 NOTE — Progress Notes (Signed)
Patient presents to clinic today c/o chronic, intermittent knee pain x 1 year. Endorses flare ups over the past week. R knee worse than L knee. Endorses kneeling constantly at work. Pain is with kneeling and rising to standing position from seated position. Notes some occasional R knee swelling. Denies numbness, tingling, weakness or bruising. Denies buckling of knees.   Patient also with history of significant GERD, previously followed by GI. Has been on numerous medications, currently on Dexilant 60 mg and Zantac 300 mg. Notes breakthrough symptoms at night regardless of diet. Is not currently seeing a GI specialist. Would like to discuss options. Denies abdominal pain or change to bowel habits.   Patient also endorses aching of most joints noted some mornings, especially on days after he works. Denies snoring or history of apneic episodes. Notes mattress is very old and may be contributing. Denies stiffness or aching of hands, mainly large joints (shoulders, knees, hips). Denies family history of RA. Endorses playing sports when he was younger but mostly non-contact (baseball).   Patient also endorses history of yeast rash of skin. Chart reviewed revealing + history of tinea versicolor. Patient states he has had a recurrence of the hypopigmented areas with some itching when he gets warm or after showers. Would like assessment to determine restarting treatment.   Past Medical History:  Diagnosis Date  . Acid reflux   . GERD (gastroesophageal reflux disease)   . History of chicken pox   . IBS (irritable bowel syndrome)     Current Outpatient Prescriptions on File Prior to Visit  Medication Sig Dispense Refill  . ALPRAZolam (XANAX) 0.5 MG tablet Take 1 tablet (0.5 mg total) by mouth 2 (two) times daily as needed for anxiety. 30 tablet 1  . cyclobenzaprine (FLEXERIL) 10 MG tablet Take 1 tablet (10 mg total) by mouth at bedtime. 30 tablet 0  . dexlansoprazole (DEXILANT) 60 MG capsule Take 1  capsule (60 mg total) by mouth daily. 30 capsule 1  . dicyclomine (BENTYL) 10 MG capsule Take 1 capsule (10 mg total) by mouth 3 (three) times daily before meals. 90 capsule 0  . fluconazole (DIFLUCAN) 150 MG tablet Take 150 mg by mouth as needed.    Marland Kitchen FLUoxetine (PROZAC) 20 MG tablet Take 1 tablet (20 mg total) by mouth daily. 30 tablet 3  . ranitidine (ZANTAC) 300 MG tablet Take 1 tablet (300 mg total) by mouth at bedtime. 30 tablet 6  . traZODone (DESYREL) 50 MG tablet Take 0.5-1 tablets (25-50 mg total) by mouth at bedtime as needed for sleep. 30 tablet 1   No current facility-administered medications on file prior to visit.     No Known Allergies  History reviewed. No pertinent family history.  Social History   Social History  . Marital status: Single    Spouse name: N/A  . Number of children: N/A  . Years of education: N/A   Social History Main Topics  . Smoking status: Never Smoker  . Smokeless tobacco: Never Used  . Alcohol use Yes  . Drug use: No  . Sexual activity: Yes   Other Topics Concern  . None   Social History Narrative  . None   Review of Systems - See HPI.  All other ROS are negative.  BP 114/78   Pulse 70   Temp 98.5 F (36.9 C)   Resp 16   Ht '5\' 6"'  (1.676 m)   Wt 170 lb (77.1 kg)   SpO2 98%   BMI  27.44 kg/m   Physical Exam  Constitutional: He is oriented to person, place, and time and well-developed, well-nourished, and in no distress.  HENT:  Head: Normocephalic and atraumatic.  Eyes: Conjunctivae are normal.  Neck: Neck supple.  Cardiovascular: Normal rate, regular rhythm, normal heart sounds and intact distal pulses.   Pulmonary/Chest: Effort normal and breath sounds normal. No respiratory distress. He has no wheezes. He has no rales. He exhibits no tenderness.  Musculoskeletal:       Right knee: He exhibits normal range of motion, no swelling, normal alignment, no LCL laxity, normal patellar mobility, normal meniscus and no MCL laxity.  No tenderness found.       Left knee: He exhibits normal range of motion, no swelling, normal alignment, no LCL laxity, normal patellar mobility, normal meniscus and no MCL laxity. No tenderness found.  Neurological: He is alert and oriented to person, place, and time.  Skin: Skin is warm and dry.     Psychiatric: Affect normal.  Vitals reviewed.  Assessment/Plan: 1. Fatigue, unspecified type Seems to correspond with workload. No mention of apneic episodes on interview. Body mass index is 27.44 kg/m. BP stable. Will check basic labs today to start with. Discussed well-balanced diet, exercise. If labs unremarkable, will begin further workup.   - CBC - TSH - Sed Rate (ESR) - Basic metabolic panel  2. Arthralgia of multiple joints Feel mattress and sleep positioning a big contributor. Aches are only on waking in the morning and resolve once he starts his day. Will check sed rate today. If elevated will obtain further workup for RA and other autoimmune causes of arthralgias.  - CBC - Sed Rate (ESR)  3. Tinea versicolor Rx Diflucan once weekly x 3 weeks. Supportive measures reviewed.  4. Chronic pain of both knees Exam unremarkable. No sign of acute injury. Suspect tendon inflammation giving location of pain and constant kneeling at work. Will attempt rial or anti-inflammatory and supportive measures. If not improving, will need imaging and Sports Medicine assessment.   5. GERD Uncontrolled despite trial or multiple PPI. Will continue current PPI, Dexilant as it has worked the best so far. Stop Zantac and will try Pepcid Rx in the evening. Probiotic and diet reviewed. Again discussed that repeat GI assessment is needed. He is to schedule FU.   Leeanne Rio, PA-C

## 2016-10-14 NOTE — Patient Instructions (Addendum)
Please go to the lab for blood work. I will call you with your results. Please stop the Zantac at night. Start the Famotidine (Pepcid) instead. Continue the Dexilant.  Please use the Diflucan as directed for the rash.  Please try the Celebrex to help with knee pain. Only take once daily. Take with food. Make sure to elevate and ice legs while resting. Avoid kneeling as much as possible.  Wear knee pads. If not improving, recommend imaging and Sports Medicine assessment.

## 2016-10-14 NOTE — Progress Notes (Signed)
Pre visit review using our clinic review tool, if applicable. No additional management support is needed unless otherwise documented below in the visit note. 

## 2016-11-09 ENCOUNTER — Telehealth: Payer: Self-pay | Admitting: General Practice

## 2016-11-09 NOTE — Telephone Encounter (Signed)
Spoke with pt who advised he had seen cody for some knee pain. Pt was started on celebrex 100mg  Daily. Pt states that his medications works great. However it is causing him to use the restroom every 15-20 minutes. Pt was wondering if there might be a way to decrease the medication or increase his stomach medication. Please advise.   Pt states that he will come in for an appt to discuss if this would be better.

## 2016-11-09 NOTE — Telephone Encounter (Signed)
Already on Famotidine and Dexilant.  I would recommend starting a daily probiotic to improve bowel function and slow stools.  If no improvement on probiotic after 7-10 days, will need appt

## 2016-11-09 NOTE — Telephone Encounter (Signed)
Patient notified of PCP recommendations and is agreement and expresses an understanding.  

## 2016-11-30 ENCOUNTER — Telehealth: Payer: Self-pay | Admitting: General Practice

## 2016-11-30 NOTE — Telephone Encounter (Signed)
Would recommend adding Melatonin and if that does not work, will need appt to discuss

## 2016-11-30 NOTE — Telephone Encounter (Signed)
Patient notified of PCP recommendations and is agreement and expresses an understanding.  

## 2016-11-30 NOTE — Telephone Encounter (Signed)
Pt called in stating that he has been having some stomach flare ups but he feels as though it is because he is not sleeping well. Pt states that the trazodone is not helping him sleep and was wondering if he needed an appointment to have this medication changed or could you send in something new?

## 2016-12-16 ENCOUNTER — Other Ambulatory Visit: Payer: Self-pay | Admitting: General Practice

## 2016-12-16 MED ORDER — CELECOXIB 100 MG PO CAPS: 100.0000 mg | ORAL_CAPSULE | Freq: Every day | ORAL | 2 refills | Status: AC

## 2017-01-01 ENCOUNTER — Other Ambulatory Visit: Payer: Self-pay | Admitting: General Practice

## 2017-01-01 MED ORDER — ALPRAZOLAM 0.5 MG PO TABS: 0.5000 mg | ORAL_TABLET | Freq: Two times a day (BID) | ORAL | 0 refills | Status: AC | PRN

## 2017-01-01 NOTE — Telephone Encounter (Signed)
Medication filled to pharmacy as requested.   

## 2017-01-01 NOTE — Telephone Encounter (Signed)
Last OV 10/14/16 (cpe is due after 01/08/16) Alprazolam last filled 08/1214 #30 with 1  Pt states that he normally takes 1-2 tablets per month, like to have on hand if needed.  Pt states he will call and schedule CPE on Monday when he gets back into town.

## 2017-04-20 ENCOUNTER — Emergency Department (HOSPITAL_BASED_OUTPATIENT_CLINIC_OR_DEPARTMENT_OTHER)
Admission: EM | Admit: 2017-04-20 | Discharge: 2017-04-20 | Disposition: A | Discharge status: Home / Self Care | Payer: BLUE CROSS/BLUE SHIELD | Attending: Emergency Medicine | Admitting: Emergency Medicine

## 2017-04-20 ENCOUNTER — Encounter (HOSPITAL_BASED_OUTPATIENT_CLINIC_OR_DEPARTMENT_OTHER): Payer: Self-pay

## 2017-04-20 DIAGNOSIS — K219 Gastro-esophageal reflux disease without esophagitis: Secondary | ICD-10-CM | POA: Insufficient documentation

## 2017-04-20 DIAGNOSIS — R079 Chest pain, unspecified: Secondary | ICD-10-CM

## 2017-04-20 DIAGNOSIS — R0789 Other chest pain: Secondary | ICD-10-CM | POA: Diagnosis present

## 2017-04-20 HISTORY — DX: Anxiety disorder, unspecified: F41.9

## 2017-04-20 HISTORY — DX: Panic disorder (episodic paroxysmal anxiety): F41.0

## 2017-04-20 NOTE — ED Provider Notes (Signed)
[facility] Provider Note   CSN: [id number] Arrival date & time: 04/20/17  1549     History   Chief Complaint Chief Complaint  Patient presents with  . Chest Pain    HPI CHASETON YEPIZ is a 31 y.o. male.  HPI Patient presents to the emergency department with discomfort in his chest and shortness of breath and felt like he was having a panic attack.  He's been dealing with IBS for multiple years.  He's worked with his GI doctors extensively.  He's had 3 different GI evaluations.  He's had 2 endoscopies and 2 colonoscopies.  He is on a host of medications to simply control his anxiety and GERD.  He feels much better at this time.  He states that initially he began having sweating and shortness of breath and central chest tightness.  This was in the setting of having nausea GERD and acutely started vomiting.  He's recently been started on Paxil reports his had no change in his symptoms.  He has not sought evaluation by a psychiatrist yet.  He has not met with nutritionist.   Past Medical History:  Diagnosis Date  . Acid reflux   . Anxiety   . GERD (gastroesophageal reflux disease)   . History of chicken pox   . IBS (irritable bowel syndrome)   . Panic attacks     Patient Active Problem List   Diagnosis Date Noted  . Fatigue 01/06/2016  . Hematemesis 01/06/2016  . Strain of abdominal muscle 07/04/2015  . Gastritis and gastroduodenitis 06/03/2015  . Physical exam 01/04/2015  . Anxiety state 07/04/2014  . Tinea versicolor 07/04/2014  . Feeling bilious 01/16/2014  . Abdominal pain 12/19/2013  . Acid reflux 12/19/2013    History reviewed. No pertinent surgical history.     Home Medications    Prior to Admission medications   Medication Sig Start Date End Date Taking? Authorizing Provider  ALPRAZolam Duanne Moron) 0.5 MG tablet Take 1 tablet (0.5 mg total) by mouth 2 (two) times daily as needed for anxiety. 01/01/17   Brunetta Jeans, PA-C  celecoxib (CELEBREX)  100 MG capsule Take 1 capsule (100 mg total) by mouth daily. 12/16/16   Midge Minium, MD  cyclobenzaprine (FLEXERIL) 10 MG tablet Take 1 tablet (10 mg total) by mouth at bedtime. 07/04/15   Midge Minium, MD  dexlansoprazole (DEXILANT) 60 MG capsule Take 1 capsule (60 mg total) by mouth daily. 06/02/16   Brunetta Jeans, PA-C  dicyclomine (BENTYL) 10 MG capsule Take 1 capsule (10 mg total) by mouth 3 (three) times daily before meals. 06/02/16   Brunetta Jeans, PA-C  famotidine (PEPCID) 40 MG tablet Take 1 tablet (40 mg total) by mouth at bedtime. 10/14/16   Brunetta Jeans, PA-C  fluconazole (DIFLUCAN) 150 MG tablet Once weekly x3 weeks 10/14/16   Brunetta Jeans, PA-C  FLUoxetine (PROZAC) 20 MG tablet Take 1 tablet (20 mg total) by mouth daily. 06/03/15   Midge Minium, MD  traZODone (DESYREL) 50 MG tablet Take 0.5-1 tablets (25-50 mg total) by mouth at bedtime as needed for sleep. 06/02/16   Brunetta Jeans, PA-C    Family History No family history on file.  Social History Social History  Substance Use Topics  . Smoking status: Never Smoker  . Smokeless tobacco: Never Used  . Alcohol use Yes     Comment: occ     Allergies   Patient has no known allergies.   Review  of Systems Review of Systems  All other systems reviewed and are negative.    Physical Exam Updated Vital Signs BP (!) 138/98 (BP Location: Left Arm)   Pulse 73   Temp 98.1 F (36.7 C) (Oral)   Resp 18   Ht 5' 6" (1.676 m)   Wt 78.9 kg (174 lb)   SpO2 99%   BMI 28.08 kg/m   Physical Exam  Constitutional: He is oriented to person, place, and time. He appears well-developed and well-nourished.  HENT:  Head: Normocephalic and atraumatic.  Eyes: EOM are normal.  Neck: Normal range of motion.  Cardiovascular: Normal rate, regular rhythm and normal heart sounds.   Pulmonary/Chest: Effort normal and breath sounds normal. No respiratory distress.  Abdominal: Soft. He exhibits no  distension. There is no tenderness.  Musculoskeletal: Normal range of motion.  Neurological: He is alert and oriented to person, place, and time.  Skin: Skin is warm and dry.  Psychiatric: He has a normal mood and affect. Judgment normal.  Nursing note and vitals reviewed.    ED Treatments / Results  Labs (all labs ordered are listed, but only abnormal results are displayed) Labs Reviewed - No data to display  EKG  EKG Interpretation  Date/Time:  Tuesday Apr 20 2017 15:59:42 EDT Ventricular Rate:  70 PR Interval:  126 QRS Duration: 86 QT Interval:  360 QTC Calculation: 388 R Axis:   54 Text Interpretation:  Normal sinus rhythm Normal ECG No old tracing to compare Confirmed by Jola Schmidt [phone]) on 04/20/2017 5:47:48 PM       Radiology No results found.  Procedures Procedures (including critical care time)  Medications Ordered in ED Medications - No data to display   Initial Impression / Assessment and Plan / ED Course  I have reviewed the triage vital signs and the nursing notes.  Pertinent labs & imaging results that were available during my care of the patient were reviewed by me and considered in my medical decision making (see chart for details).     Patient is overall well-appearing.  This is likely anxiety and IBS as well as gastroesophageal reflux disease.  Recommended evaluation by a nutritional list and a psychiatrist.  Doubt ACS.  Doubt PE.  Well-appearing.  Discharge home in good condition  Final Clinical Impressions(s) / ED Diagnoses   Final diagnoses:  Chest pain, unspecified type  Gastroesophageal reflux disease, esophagitis presence not specified    New Prescriptions New Prescriptions   No medications on file     Jola Schmidt, MD 04/20/17 1753

## 2017-04-20 NOTE — ED Notes (Signed)
Pt states he has a long history of acid reflux, IBS, secondary to anxiety. Pt states today he started "not feeling right" this morning. Pt went to work which pt describes as a high stress environment and he became worse. Pt states suddenly he started sweating, having a HA, ShOB, central chest tightness, and feeling like "the walls were closing in." Pt states symptoms started to resolve when his father was able to calm him down. Pt is on new medication that he started x2 weeks ago for anxiety.

## 2017-04-20 NOTE — ED Triage Notes (Signed)
C/o not feeling well today-Chest tightness x 1 hour PTA-NAD-steady gait

## 2017-04-20 NOTE — ED Triage Notes (Signed)
Pt c/o SOB and feeling like he is having panic attacks. Took alprazolam earlier which wore off and now he feels shaky. HR 74, O2 sats 100% by nursefirst

## 2017-04-20 NOTE — ED Notes (Signed)
Pt states most of his symptoms have resolved at this time. Right now pt only feels "shaky" and tired.

## 2017-07-08 ENCOUNTER — Other Ambulatory Visit: Payer: Self-pay | Admitting: General Practice

## 2017-07-08 MED ORDER — FLUCONAZOLE 150 MG PO TABS: ORAL_TABLET | ORAL | 2 refills | Status: DC

## 2017-09-15 ENCOUNTER — Emergency Department (HOSPITAL_BASED_OUTPATIENT_CLINIC_OR_DEPARTMENT_OTHER)
Admission: EM | Admit: 2017-09-15 | Discharge: 2017-09-16 | Disposition: A | Discharge status: Home / Self Care | Payer: Self-pay | Attending: Emergency Medicine | Admitting: Emergency Medicine

## 2017-09-15 ENCOUNTER — Emergency Department (HOSPITAL_BASED_OUTPATIENT_CLINIC_OR_DEPARTMENT_OTHER): Payer: Self-pay

## 2017-09-15 ENCOUNTER — Encounter (HOSPITAL_BASED_OUTPATIENT_CLINIC_OR_DEPARTMENT_OTHER): Payer: Self-pay

## 2017-09-15 DIAGNOSIS — E860 Dehydration: Secondary | ICD-10-CM | POA: Insufficient documentation

## 2017-09-15 DIAGNOSIS — Z79899 Other long term (current) drug therapy: Secondary | ICD-10-CM | POA: Insufficient documentation

## 2017-09-15 DIAGNOSIS — R000 Tachycardia, unspecified: Secondary | ICD-10-CM | POA: Insufficient documentation

## 2017-09-15 DIAGNOSIS — R1084 Generalized abdominal pain: Secondary | ICD-10-CM

## 2017-09-15 DIAGNOSIS — R197 Diarrhea, unspecified: Secondary | ICD-10-CM

## 2017-09-15 DIAGNOSIS — K529 Noninfective gastroenteritis and colitis, unspecified: Secondary | ICD-10-CM | POA: Insufficient documentation

## 2017-09-15 DIAGNOSIS — R112 Nausea with vomiting, unspecified: Secondary | ICD-10-CM | POA: Insufficient documentation

## 2017-09-15 LAB — CBC WITH DIFFERENTIAL/PLATELET
Basophils Absolute: 0 10*3/uL (ref 0.0–0.1)
Basophils Relative: 0 %
EOS PCT: 3 %
Eosinophils Absolute: 0.2 10*3/uL (ref 0.0–0.7)
HCT: 41.6 % (ref 39.0–52.0)
Hemoglobin: 14.6 g/dL (ref 13.0–17.0)
LYMPHS ABS: 1 10*3/uL (ref 0.7–4.0)
Lymphocytes Relative: 15 %
MCH: 30 pg (ref 26.0–34.0)
MCHC: 35.1 g/dL (ref 30.0–36.0)
MCV: 85.6 fL (ref 78.0–100.0)
MONO ABS: 0.5 10*3/uL (ref 0.1–1.0)
Monocytes Relative: 8 %
Neutro Abs: 4.7 10*3/uL (ref 1.7–7.7)
Neutrophils Relative %: 74 %
PLATELETS: 154 10*3/uL (ref 150–400)
RBC: 4.86 MIL/uL (ref 4.22–5.81)
RDW: 13.6 % (ref 11.5–15.5)
WBC: 6.4 10*3/uL (ref 4.0–10.5)

## 2017-09-15 LAB — URINALYSIS, ROUTINE W REFLEX MICROSCOPIC
Glucose, UA: NEGATIVE mg/dL
Hgb urine dipstick: NEGATIVE
Ketones, ur: 15 mg/dL — AB
LEUKOCYTES UA: NEGATIVE
NITRITE: NEGATIVE
PROTEIN: NEGATIVE mg/dL
Specific Gravity, Urine: 1.02 (ref 1.005–1.030)
pH: 6 (ref 5.0–8.0)

## 2017-09-15 LAB — COMPREHENSIVE METABOLIC PANEL
ALT: 26 U/L (ref 17–63)
ANION GAP: 9 (ref 5–15)
AST: 23 U/L (ref 15–41)
Albumin: 3.9 g/dL (ref 3.5–5.0)
Alkaline Phosphatase: 67 U/L (ref 38–126)
BUN: 16 mg/dL (ref 6–20)
CHLORIDE: 100 mmol/L — AB (ref 101–111)
CO2: 24 mmol/L (ref 22–32)
Calcium: 8.4 mg/dL — ABNORMAL LOW (ref 8.9–10.3)
Creatinine, Ser: 1.22 mg/dL (ref 0.61–1.24)
Glucose, Bld: 99 mg/dL (ref 65–99)
POTASSIUM: 3.7 mmol/L (ref 3.5–5.1)
Sodium: 133 mmol/L — ABNORMAL LOW (ref 135–145)
Total Bilirubin: 1.6 mg/dL — ABNORMAL HIGH (ref 0.3–1.2)
Total Protein: 6.9 g/dL (ref 6.5–8.1)

## 2017-09-15 LAB — LIPASE, BLOOD: Lipase: 24 U/L (ref 11–51)

## 2017-09-15 MED ORDER — ONDANSETRON HCL 4 MG/2ML IJ SOLN
4.0000 mg | Freq: Once | INTRAMUSCULAR | Status: AC
  Administered 2017-09-15: 4 mg via INTRAVENOUS
  Filled 2017-09-15: qty 2

## 2017-09-15 MED ORDER — SODIUM CHLORIDE 0.9 % IV BOLUS (SEPSIS)
1000.0000 mL | Freq: Once | INTRAVENOUS | Status: AC
  Administered 2017-09-15: 1000 mL via INTRAVENOUS

## 2017-09-15 MED ORDER — MORPHINE SULFATE (PF) 4 MG/ML IV SOLN
4.0000 mg | Freq: Once | INTRAVENOUS | Status: AC
  Administered 2017-09-15: 4 mg via INTRAVENOUS
  Filled 2017-09-15: qty 1

## 2017-09-15 MED ORDER — IOPAMIDOL (ISOVUE-300) INJECTION 61%
100.0000 mL | Freq: Once | INTRAVENOUS | Status: AC | PRN
  Administered 2017-09-15: 100 mL via INTRAVENOUS

## 2017-09-15 NOTE — ED Triage Notes (Signed)
C/o n/v/d, fever day 4-NAD-steady gait

## 2017-09-15 NOTE — ED Notes (Signed)
Patient transported to CT 

## 2017-09-15 NOTE — ED Provider Notes (Signed)
[facility] EMERGENCY DEPARTMENT Provider Note   CSN: [id number] Arrival date & time: 09/15/17  2050     History   Chief Complaint Chief Complaint  Patient presents with  . Diarrhea    HPI Tanner Wallace is a 31 y.o. male.  The history is provided by the patient and medical records. No language interpreter was used.  Abdominal Pain   This is a new problem. The current episode started more than 2 days ago (4 days of worsening). The problem occurs constantly. The problem has been gradually worsening. The pain is located in the generalized abdominal region. The pain is at a severity of 9/10. The pain is severe. Associated symptoms include fever, diarrhea, nausea and vomiting. Pertinent negatives include constipation, dysuria, frequency, hematuria and headaches. The symptoms are aggravated by palpation and eating. Nothing relieves the symptoms. His past medical history is significant for irritable bowel syndrome.    Past Medical History:  Diagnosis Date  . Acid reflux   . Anxiety   . GERD (gastroesophageal reflux disease)   . History of chicken pox   . IBS (irritable bowel syndrome)   . Panic attacks     Patient Active Problem List   Diagnosis Date Noted  . Fatigue 01/06/2016  . Hematemesis 01/06/2016  . Strain of abdominal muscle 07/04/2015  . Gastritis and gastroduodenitis 06/03/2015  . Physical exam 01/04/2015  . Anxiety state 07/04/2014  . Tinea versicolor 07/04/2014  . Feeling bilious 01/16/2014  . Abdominal pain 12/19/2013  . Acid reflux 12/19/2013    History reviewed. No pertinent surgical history.     Home Medications    Prior to Admission medications   Medication Sig Start Date End Date Taking? Authorizing Provider  ranitidine (ZANTAC) 300 MG tablet Take 300 mg by mouth at bedtime.   Yes [provider]  sucralfate (CARAFATE) 1 g tablet Take 1 g by mouth 4 (four) times daily -  with meals and at bedtime.   Yes [provider]   ALPRAZolam (XANAX) 0.5 MG tablet Take 1 tablet (0.5 mg total) by mouth 2 (two) times daily as needed for anxiety. 01/01/17   Waldon MerlMartin, William C, PA-C  celecoxib (CELEBREX) 100 MG capsule Take 1 capsule (100 mg total) by mouth daily. 12/16/16   Sheliah Hatchabori, Katherine E, MD  cyclobenzaprine (FLEXERIL) 10 MG tablet Take 1 tablet (10 mg total) by mouth at bedtime. 07/04/15   Sheliah Hatchabori, Katherine E, MD  dexlansoprazole (DEXILANT) 60 MG capsule Take 1 capsule (60 mg total) by mouth daily. 06/02/16   Waldon MerlMartin, William C, PA-C  dicyclomine (BENTYL) 10 MG capsule Take 1 capsule (10 mg total) by mouth 3 (three) times daily before meals. 06/02/16   Waldon MerlMartin, William C, PA-C  famotidine (PEPCID) 40 MG tablet Take 1 tablet (40 mg total) by mouth at bedtime. 10/14/16   Waldon MerlMartin, William C, PA-C  fluconazole (DIFLUCAN) 150 MG tablet Once weekly x3 weeks 07/08/17   Sheliah Hatchabori, Katherine E, MD  FLUoxetine (PROZAC) 20 MG tablet Take 1 tablet (20 mg total) by mouth daily. 06/03/15   Sheliah Hatchabori, Katherine E, MD  traZODone (DESYREL) 50 MG tablet Take 0.5-1 tablets (25-50 mg total) by mouth at bedtime as needed for sleep. 06/02/16   Waldon MerlMartin, William C, PA-C    Family History No family history on file.  Social History Social History  Substance Use Topics  . Smoking status: Never Smoker  . Smokeless tobacco: Never Used  . Alcohol use Yes     Comment: occ  Allergies   Patient has no known allergies.   Review of Systems Review of Systems  Constitutional: Positive for chills, fatigue and fever. Negative for diaphoresis.  HENT: Negative for congestion.   Eyes: Negative for visual disturbance.  Respiratory: Negative for cough, chest tightness, shortness of breath, wheezing and stridor.   Gastrointestinal: Positive for abdominal distention, abdominal pain, diarrhea, nausea and vomiting. Negative for anal bleeding and constipation.  Genitourinary: Positive for decreased urine volume. Negative for dysuria, frequency, hematuria, penile pain,  scrotal swelling and testicular pain.  Musculoskeletal: Negative for back pain, neck pain and neck stiffness.  Skin: Negative for rash and wound.  Neurological: Negative for light-headedness, numbness and headaches.  Psychiatric/Behavioral: Negative for agitation.  All other systems reviewed and are negative.    Physical Exam Updated Vital Signs BP (!) 129/94 (BP Location: Right Arm)   Pulse (!) 103   Temp 99.2 F (37.3 C) (Oral)   Resp 18   Ht 5\' 6"  (1.676 m)   Wt 79.4 kg (175 lb)   SpO2 100%   BMI 28.25 kg/m   Physical Exam  Constitutional: He is oriented to person, place, and time. He appears well-developed and well-nourished. No distress.  HENT:  Head: Normocephalic.  Mouth/Throat: Oropharynx is clear and moist. No oropharyngeal exudate.  Eyes: Pupils are equal, round, and reactive to light. Conjunctivae and EOM are normal.  Cardiovascular: Intact distal pulses.  Tachycardia present.   No murmur heard. Pulmonary/Chest: Effort normal. No respiratory distress. He has no wheezes. He exhibits no tenderness.  Abdominal: Soft. He exhibits distension. There is generalized tenderness. There is no rigidity, no rebound and no CVA tenderness.  Musculoskeletal: He exhibits no tenderness.  Neurological: He is alert and oriented to person, place, and time. No sensory deficit. He exhibits normal muscle tone.  Skin: Skin is warm. Capillary refill takes less than 2 seconds. He is not diaphoretic.  Psychiatric: He has a normal mood and affect.  Nursing note and vitals reviewed.    ED Treatments / Results  Labs (all labs ordered are listed, but only abnormal results are displayed) Labs Reviewed  COMPREHENSIVE METABOLIC PANEL - Abnormal; Notable for the following:       Result Value   Sodium 133 (*)    Chloride 100 (*)    Calcium 8.4 (*)    Total Bilirubin 1.6 (*)    All other components within normal limits  URINALYSIS, ROUTINE W REFLEX MICROSCOPIC - Abnormal; Notable for the  following:    Bilirubin Urine SMALL (*)    Ketones, ur 15 (*)    All other components within normal limits  CBC WITH DIFFERENTIAL/PLATELET  LIPASE, BLOOD    EKG  EKG Interpretation None       Radiology No results found.  Procedures Procedures (including critical care time)  Medications Ordered in ED Medications  ciprofloxacin (CIPRO) IVPB 400 mg (not administered)  metroNIDAZOLE (FLAGYL) IVPB 500 mg (not administered)  sodium chloride 0.9 % bolus 1,000 mL (0 mLs Intravenous Stopped 09/15/17 2240)  ondansetron (ZOFRAN) injection 4 mg (4 mg Intravenous Given 09/15/17 2150)  sodium chloride 0.9 % bolus 1,000 mL (0 mLs Intravenous Stopped 09/15/17 2345)  morphine 4 MG/ML injection 4 mg (4 mg Intravenous Given 09/15/17 2245)  ondansetron (ZOFRAN) injection 4 mg (4 mg Intravenous Given 09/15/17 2240)  iopamidol (ISOVUE-300) 61 % injection 100 mL (100 mLs Intravenous Contrast Given 09/15/17 2332)  LORazepam (ATIVAN) injection 1 mg (1 mg Intravenous Given 09/16/17 0026)  sodium chloride 0.9 %  bolus 1,000 mL (1,000 mLs Intravenous New Bag/Given 09/16/17 0027)     Initial Impression / Assessment and Plan / ED Course  I have reviewed the triage vital signs and the nursing notes.  Pertinent labs & imaging results that were available during my care of the patient were reviewed by me and considered in my medical decision making (see chart for details).     Tanner Wallace is a 31 y.o. male with a history of GERD, irritable bowel syndrome, and anxiety who presents with nausea, vomiting, diarrhea, abdominal pain, abdominal distention, fever, and intolerance of oral intake.  Patient reports that he has had abdominal troubles for years.  He says that he typically has several episodes of loose bowel movements a day and has nausea or vomiting but over the last 4 days has had severe worsening of symptoms.  He describes fever of approximately 101 for the last 4 days.  He describes vomiting small  amounts of blood.  He reports greater than 10 episodes of diarrhea daily for the last 4 days as well as severe abdominal discomfort.  He describes as an aching and sharp pain across his abdomen with a focus in the right lower quadrant.  He has no history of appendicitis or gallbladder disease.  He denies any trauma.  He says that he has been unable to tolerate any oral intake of food or fluids for 4 days.  He says he is feeling dehydrated.  He denies any blood in his diarrhea and denies any sick contacts.  Patient denies any congestion, cough, or shortness of breath.  He denies any urinary symptoms.  On arrival, patient was tachycardic and febrile with a rectal temperature.  On exam, abdomen is diffusely tender with more tenderness in the right lower quadrant.  No CVA tenderness or back tenderness.  Lungs are clear.  No crepitance was felt in the neck or chest.  Patient denied any groin symptoms and GU exam was deferred.  Based on patient's history of irritable bowel syndrome, this was felt to contribute to his symptoms today.  Patient said that he saw his PCP yesterday and was given a prescription for antibiotics although he does not remember what they are.  He says that he is been able to keep any of them down including nausea medicine.  Given the tenderness in the right lower quadrant, the vomiting blood, and the diarrhea, patient will have CT imaging to evaluate for abscess, perforation, or appendicitis as well as other etiologies.  Patient given pain medicine, nausea medicine, and fluids.    Patient's urinalysis showed no evidence of infection but there was evidence of ketones likely suggestive of the dehydration.  CBC was reassuring.  No leukocytosis.  Lipase not elevated.  CMP showed mild hyponatremia and hypochloremia likely due to the vomiting and diarrhea.  Chest x-ray showed no subcutaneous air or evidence of pneumonia.  There was a stellate shadow seen in the chest that patient was informed  of.  Patient will follow-up with PCP for reassessment and further workup of this.  CT of the abdomen and pelvis showed evidence of diarrhea filled colon with diverticuli but no diverticulitis.  No evidence of obstruction or appendicitis.  They suspect enteritis.  Given the patient's symptoms of fever and severe diarrhea with abdominal pain, patient will be treated for enteritis/colitis.  C. difficile and ova/parasite testing was ordered.  Patient given IV antibiotics as he cannot tolerate p.o.  Patient received 2 L of fluid and remained tachycardic  with a rate between 100's - 120s.    Patient will be given 1/3 L of fluids as well as Ativan for nausea.  As patient has been unable to tolerate his outpatient antibiotics and medicines, suspect patient may need admission for rehydration and fluids.  Patient wants to wait and be reassessed after the third liter of fluids and his medications.  If patient continues to have abdominal pain, nausea, vomiting, and raises concern of ability to maintain hydration at home for his outpatient treatment plan, patient will need admission.  If patient is feeling better and is able to tolerate oral intake, patient may be discharged with oral antibiotics, pain medicine, and nausea medicine with plans to follow-up with PCP in the next several days.  Patient and family voiced understanding of the plan of care and care will be transferred to Dr. Read Drivers while awaiting reassessment.  Care transferred in stable condition.      Final Clinical Impressions(s) / ED Diagnoses   Final diagnoses:  Nausea vomiting and diarrhea  Generalized abdominal pain  Enteritis  Tachycardia  Dehydration     Clinical Impression: 1. Nausea vomiting and diarrhea   2. Generalized abdominal pain   3. Enteritis   4. Tachycardia   5. Dehydration     Disposition: Awaiting reassessment after more fluids, medications, and IV antibiotics.    Tegeler, Canary Brim, MD 09/16/17 [phone]

## 2017-09-16 LAB — C DIFFICILE QUICK SCREEN W PCR REFLEX
C Diff antigen: NEGATIVE
C Diff interpretation: NOT DETECTED
C Diff toxin: NEGATIVE

## 2017-09-16 MED ORDER — CIPROFLOXACIN IN D5W 400 MG/200ML IV SOLN
400.0000 mg | Freq: Once | INTRAVENOUS | Status: AC
  Administered 2017-09-16: 400 mg via INTRAVENOUS
  Filled 2017-09-16: qty 200

## 2017-09-16 MED ORDER — METRONIDAZOLE IN NACL 5-0.79 MG/ML-% IV SOLN
500.0000 mg | Freq: Once | INTRAVENOUS | Status: AC
  Administered 2017-09-16: 500 mg via INTRAVENOUS
  Filled 2017-09-16: qty 100

## 2017-09-16 MED ORDER — LORAZEPAM 2 MG/ML IJ SOLN
1.0000 mg | Freq: Once | INTRAMUSCULAR | Status: AC
  Administered 2017-09-16: 1 mg via INTRAVENOUS
  Filled 2017-09-16: qty 1

## 2017-09-16 MED ORDER — SODIUM CHLORIDE 0.9 % IV BOLUS (SEPSIS)
1000.0000 mL | Freq: Once | INTRAVENOUS | Status: AC
  Administered 2017-09-16: 1000 mL via INTRAVENOUS

## 2017-09-16 NOTE — ED Provider Notes (Signed)
2:32 AM Patient able to tolerate oral intake without emesis.  He was given IV antibiotics as ordered by Dr. Rush Landmarkegeler.  He was advised to resume his oral antibiotics as prescribed yesterday.      Tanner Wallace, Tanner Perlow, MD 09/16/17 [phone]

## 2017-09-21 LAB — CULTURE, BLOOD (ROUTINE X 2)
Culture: NO GROWTH
Culture: NO GROWTH
SPECIAL REQUESTS: ADEQUATE
Special Requests: ADEQUATE

## 2017-09-22 LAB — OVA + PARASITE EXAM

## 2017-09-22 LAB — O&P RESULT

## 2021-04-14 ENCOUNTER — Other Ambulatory Visit: Payer: Self-pay

## 2021-04-14 ENCOUNTER — Emergency Department (HOSPITAL_BASED_OUTPATIENT_CLINIC_OR_DEPARTMENT_OTHER)
Admission: EM | Admit: 2021-04-14 | Discharge: 2021-04-14 | Disposition: A | Discharge status: Home / Self Care | Payer: 59 | Attending: Emergency Medicine | Admitting: Emergency Medicine

## 2021-04-14 DIAGNOSIS — R42 Dizziness and giddiness: Secondary | ICD-10-CM | POA: Insufficient documentation

## 2021-04-14 DIAGNOSIS — R0602 Shortness of breath: Secondary | ICD-10-CM | POA: Insufficient documentation

## 2021-04-14 DIAGNOSIS — R112 Nausea with vomiting, unspecified: Secondary | ICD-10-CM | POA: Diagnosis not present

## 2021-04-14 DIAGNOSIS — R1013 Epigastric pain: Secondary | ICD-10-CM | POA: Insufficient documentation

## 2021-04-14 LAB — COMPREHENSIVE METABOLIC PANEL
ALT: 18 U/L (ref 0–44)
AST: 18 U/L (ref 15–41)
Albumin: 4.3 g/dL (ref 3.5–5.0)
Alkaline Phosphatase: 47 U/L (ref 38–126)
Anion gap: 11 (ref 5–15)
BUN: 14 mg/dL (ref 6–20)
CO2: 24 mmol/L (ref 22–32)
Calcium: 9.5 mg/dL (ref 8.9–10.3)
Chloride: 107 mmol/L (ref 98–111)
Creatinine, Ser: 1 mg/dL (ref 0.61–1.24)
GFR, Estimated: >60 mL/min (ref >60)
Glucose, Bld: 89 mg/dL (ref 70–99)
Potassium: 3.8 mmol/L (ref 3.5–5.1)
Sodium: 142 mmol/L (ref 135–145)
Total Bilirubin: 0.9 mg/dL (ref 0.3–1.2)
Total Protein: 7.4 g/dL (ref 6.5–8.1)

## 2021-04-14 LAB — CBC WITH DIFFERENTIAL/PLATELET
Abs Immature Granulocytes: 0.01 10*3/uL (ref 0.00–0.07)
Basophils Absolute: 0 10*3/uL (ref 0.0–0.1)
Basophils Relative: 0 %
Eosinophils Absolute: 0.1 10*3/uL (ref 0.0–0.5)
Eosinophils Relative: 2 %
HCT: 42 % (ref 39.0–52.0)
Hemoglobin: 14.9 g/dL (ref 13.0–17.0)
Immature Granulocytes: 0 %
Lymphocytes Relative: 39 %
Lymphs Abs: 2.1 10*3/uL (ref 0.7–4.0)
MCH: 31.3 pg (ref 26.0–34.0)
MCHC: 35.5 g/dL (ref 30.0–36.0)
MCV: 88.2 fL (ref 80.0–100.0)
Monocytes Absolute: 0.4 10*3/uL (ref 0.1–1.0)
Monocytes Relative: 7 %
Neutro Abs: 2.7 10*3/uL (ref 1.7–7.7)
Neutrophils Relative %: 52 %
Platelets: 202 10*3/uL (ref 150–400)
RBC: 4.76 MIL/uL (ref 4.22–5.81)
RDW: 12.8 % (ref 11.5–15.5)
WBC: 5.3 10*3/uL (ref 4.0–10.5)
nRBC: 0 % (ref 0.0–0.2)

## 2021-04-14 LAB — TROPONIN I (HIGH SENSITIVITY): Troponin I (High Sensitivity): <2 ng/L (ref <18)

## 2021-04-14 LAB — LIPASE, BLOOD: Lipase: 42 U/L (ref 11–51)

## 2021-04-14 MED ORDER — SODIUM CHLORIDE 0.9 % IV BOLUS
1000.0000 mL | Freq: Once | INTRAVENOUS | Status: AC
  Administered 2021-04-14: 1000 mL via INTRAVENOUS

## 2021-04-14 MED ORDER — KETOROLAC TROMETHAMINE 30 MG/ML IJ SOLN
30.0000 mg | Freq: Once | INTRAMUSCULAR | Status: AC
  Administered 2021-04-14: 30 mg via INTRAVENOUS
  Filled 2021-04-14: qty 1

## 2021-04-14 MED ORDER — ONDANSETRON HCL 4 MG/2ML IJ SOLN
4.0000 mg | Freq: Once | INTRAMUSCULAR | Status: AC
  Administered 2021-04-14: 4 mg via INTRAVENOUS
  Filled 2021-04-14: qty 2

## 2021-04-14 NOTE — Discharge Instructions (Addendum)
Continue medications as previously prescribed.  Return to the emergency department if you develop severe abdominal pain, bloody stools, high fevers, or other new and concerning symptoms.

## 2021-04-14 NOTE — ED Triage Notes (Signed)
Presents to ED for nausea, dry-heaves, abd pain, "not feeling right" (SHOB, feeling near-syncopal) that started at 0400. Pt states he has had on-going issues with hypotension and bradycardia. BP 136/97, HR 80 during triage. He states he still feels lightheaded and dizzy and "my insides feel like they're shaking like crazy."

## 2021-04-14 NOTE — ED Provider Notes (Signed)
[facility] EMERGENCY DEPARTMENT Provider Note   CSN: [id number] Arrival date & time: 04/14/21  3557     History No chief complaint on file.   Tanner Wallace is a 35 y.o. male.  Patient is a 35 year old male with longstanding history of GI issues.  He presents today with complaints of abdominal pain, dizziness, nausea, and "dry heaves" that started this morning at approximately 4 AM.  This came on suddenly and woke him from sleep.  His abdominal discomfort is primarily in the epigastric region.  He denies any fevers or chills.  He denies any bloody stool or vomit.  He denies any ill contacts.  He describes his insides as feeling shaky.  Patient has undergone extensive GI work-up and has been diagnosed with irritable bowel and possible gastroenteritis.  He has also had a fundoplication in the past.  The history is provided by the patient.       Past Medical History:  Diagnosis Date  . Acid reflux   . Anxiety   . GERD (gastroesophageal reflux disease)   . History of chicken pox   . IBS (irritable bowel syndrome)   . Panic attacks     Patient Active Problem List   Diagnosis Date Noted  . Fatigue 01/06/2016  . Hematemesis 01/06/2016  . Strain of abdominal muscle 07/04/2015  . Gastritis and gastroduodenitis 06/03/2015  . Physical exam 01/04/2015  . Anxiety state 07/04/2014  . Tinea versicolor 07/04/2014  . Feeling bilious 01/16/2014  . Abdominal pain 12/19/2013  . Acid reflux 12/19/2013    No past surgical history on file.     No family history on file.  Social History   Tobacco Use  . Smoking status: Never Smoker  . Smokeless tobacco: Never Used  Substance Use Topics  . Alcohol use: Yes    Comment: occ  . Drug use: No    Home Medications Prior to Admission medications   Medication Sig Start Date End Date Taking? Authorizing Provider  ALPRAZolam Prudy Feeler) 0.5 MG tablet Take 1 tablet (0.5 mg total) by mouth 2 (two) times daily as needed for  anxiety. 01/01/17   Waldon Merl, PA-C  celecoxib (CELEBREX) 100 MG capsule Take 1 capsule (100 mg total) by mouth daily. 12/16/16   Sheliah Hatch, MD  cyclobenzaprine (FLEXERIL) 10 MG tablet Take 1 tablet (10 mg total) by mouth at bedtime. 07/04/15   Sheliah Hatch, MD  dexlansoprazole (DEXILANT) 60 MG capsule Take 1 capsule (60 mg total) by mouth daily. 06/02/16   Waldon Merl, PA-C  dicyclomine (BENTYL) 10 MG capsule Take 1 capsule (10 mg total) by mouth 3 (three) times daily before meals. 06/02/16   Waldon Merl, PA-C  famotidine (PEPCID) 40 MG tablet Take 1 tablet (40 mg total) by mouth at bedtime. 10/14/16   Waldon Merl, PA-C  FLUoxetine (PROZAC) 20 MG tablet Take 1 tablet (20 mg total) by mouth daily. 06/03/15   Sheliah Hatch, MD  ranitidine (ZANTAC) 300 MG tablet Take 300 mg by mouth at bedtime.    [provider]  sucralfate (CARAFATE) 1 g tablet Take 1 g by mouth 4 (four) times daily -  with meals and at bedtime.    [provider]  traZODone (DESYREL) 50 MG tablet Take 0.5-1 tablets (25-50 mg total) by mouth at bedtime as needed for sleep. 06/02/16   Waldon Merl, PA-C    Allergies    Patient has no known allergies.  Review of  Systems   Review of Systems  All other systems reviewed and are negative.   Physical Exam Updated Vital Signs BP (!) 136/97 (BP Location: Right Arm)   Pulse 74   Temp 98.6 F (37 C) (Oral)   Resp 18   Ht 5\' 6"  (1.676 m)   Wt 65.8 kg   SpO2 100%   BMI 23.40 kg/m   Physical Exam Vitals and nursing note reviewed.  Constitutional:      General: He is not in acute distress.    Appearance: He is well-developed. He is not diaphoretic.  HENT:     Head: Normocephalic and atraumatic.  Cardiovascular:     Rate and Rhythm: Normal rate and regular rhythm.     Heart sounds: No murmur heard. No friction rub.  Pulmonary:     Effort: Pulmonary effort is normal. No respiratory distress.     Breath  sounds: Normal breath sounds. No wheezing or rales.  Abdominal:     General: Bowel sounds are normal. There is no distension.     Palpations: Abdomen is soft.     Tenderness: There is abdominal tenderness. There is no right CVA tenderness, left CVA tenderness, guarding or rebound.     Comments: Abdomen is soft.  There is mild epigastric tenderness.  Musculoskeletal:        General: Normal range of motion.     Cervical back: Normal range of motion and neck supple.  Skin:    General: Skin is warm and dry.  Neurological:     Mental Status: He is alert and oriented to person, place, and time.     Coordination: Coordination normal.     ED Results / Procedures / Treatments   Labs (all labs ordered are listed, but only abnormal results are displayed) Labs Reviewed - No data to display  EKG EKG Interpretation  Date/Time:  Monday Apr 14 2021 05:53:38 EDT Ventricular Rate:  71 PR Interval:  134 QRS Duration: 89 QT Interval:  369 QTC Calculation: 401 R Axis:   57 Text Interpretation: Sinus arrhythmia Otherwise normal ECG Confirmed by [id number] (Geoffery Lyons) on 04/14/2021 6:21:34 AM   Radiology No results found.  Procedures Procedures   Medications Ordered in ED Medications  sodium chloride 0.9 % bolus 1,000 mL (has no administration in time range)  ondansetron (ZOFRAN) injection 4 mg (has no administration in time range)  ketorolac (TORADOL) 30 MG/ML injection 30 mg (has no administration in time range)    ED Course  I have reviewed the triage vital signs and the nursing notes.  Pertinent labs & imaging results that were available during my care of the patient were reviewed by me and considered in my medical decision making (see chart for details).    MDM Rules/Calculators/A&P  Patient with longstanding history of GI issues presenting with abdominal pain, nausea, feeling lightheaded, and short of breath.  Patient's vital signs are stable, physical examination is  unremarkable, and laboratory studies are reassuring.  His EKG shows a normal sinus rhythm with negative troponin.    Patient seems to be feeling improved after receiving IV fluids, Zofran, and Toradol.  Nothing today appears emergent and patient seems appropriate for discharge with outpatient follow-up.  Final Clinical Impression(s) / ED Diagnoses Final diagnoses:  None    Rx / DC Orders ED Discharge Orders    None       04/16/2021, MD 04/14/21 [phone]

## 2021-06-17 ENCOUNTER — Emergency Department (HOSPITAL_BASED_OUTPATIENT_CLINIC_OR_DEPARTMENT_OTHER): Payer: 59

## 2021-06-17 ENCOUNTER — Other Ambulatory Visit: Payer: Self-pay

## 2021-06-17 ENCOUNTER — Emergency Department (HOSPITAL_BASED_OUTPATIENT_CLINIC_OR_DEPARTMENT_OTHER)
Admission: EM | Admit: 2021-06-17 | Discharge: 2021-06-17 | Disposition: A | Discharge status: Home / Self Care | Payer: 59 | Attending: Emergency Medicine | Admitting: Emergency Medicine

## 2021-06-17 ENCOUNTER — Encounter (HOSPITAL_BASED_OUTPATIENT_CLINIC_OR_DEPARTMENT_OTHER): Payer: Self-pay

## 2021-06-17 DIAGNOSIS — R42 Dizziness and giddiness: Secondary | ICD-10-CM | POA: Diagnosis not present

## 2021-06-17 DIAGNOSIS — R0789 Other chest pain: Secondary | ICD-10-CM | POA: Diagnosis not present

## 2021-06-17 DIAGNOSIS — K920 Hematemesis: Secondary | ICD-10-CM

## 2021-06-17 LAB — CBC
HCT: 42.8 % (ref 39.0–52.0)
Hemoglobin: 15.1 g/dL (ref 13.0–17.0)
MCH: 30.9 pg (ref 26.0–34.0)
MCHC: 35.3 g/dL (ref 30.0–36.0)
MCV: 87.7 fL (ref 80.0–100.0)
Platelets: 243 10*3/uL (ref 150–400)
RBC: 4.88 MIL/uL (ref 4.22–5.81)
RDW: 13 % (ref 11.5–15.5)
WBC: 5.8 10*3/uL (ref 4.0–10.5)
nRBC: 0 % (ref 0.0–0.2)

## 2021-06-17 LAB — TROPONIN I (HIGH SENSITIVITY): Troponin I (High Sensitivity): 2 ng/L (ref <18)

## 2021-06-17 LAB — BASIC METABOLIC PANEL
Anion gap: 3 — ABNORMAL LOW (ref 5–15)
BUN: 14 mg/dL (ref 6–20)
CO2: 28 mmol/L (ref 22–32)
Calcium: 8.8 mg/dL — ABNORMAL LOW (ref 8.9–10.3)
Chloride: 107 mmol/L (ref 98–111)
Creatinine, Ser: 1.08 mg/dL (ref 0.61–1.24)
GFR, Estimated: >60 mL/min (ref >60)
Glucose, Bld: 96 mg/dL (ref 70–99)
Potassium: 4.4 mmol/L (ref 3.5–5.1)
Sodium: 138 mmol/L (ref 135–145)

## 2021-06-17 LAB — LIPASE, BLOOD: Lipase: 37 U/L (ref 11–51)

## 2021-06-17 MED ORDER — ONDANSETRON 4 MG PO TBDP: 4.0000 mg | ORAL_TABLET | Freq: Three times a day (TID) | ORAL | 0 refills | Status: DC | PRN

## 2021-06-17 NOTE — ED Provider Notes (Signed)
[facility] EMERGENCY DEPARTMENT Provider Note   CSN: [id number] Arrival date & time: 06/17/21  1122     History Chief Complaint  Patient presents with   Chest Pain   Emesis    Tanner Wallace is a 35 y.o. male.  35 year old male presents with complaint of midsternal chest discomfort with nausea and lightheadedness.  The symptoms have been ongoing for some time, currently undergoing work-up with cardiology with recent Holter monitor completed.  What prompted patient's visit to the emergency room today was an episode of his lightheadedness followed by nausea with vomiting which produced a small amount of bright red blood with numerous episodes of emesis.  Denies abdominal pain, NSAID use, history of peptic ulcer disease, changes in stools. Prior history of reflux treated surgically, IBS, gastritis and duodenitis.  Takes Prilosec once daily.      Past Medical History:  Diagnosis Date   Acid reflux    Anxiety    GERD (gastroesophageal reflux disease)    History of chicken pox    IBS (irritable bowel syndrome)    Panic attacks     Patient Active Problem List   Diagnosis Date Noted   Fatigue 01/06/2016   Hematemesis 01/06/2016   Strain of abdominal muscle 07/04/2015   Gastritis and gastroduodenitis 06/03/2015   Physical exam 01/04/2015   Anxiety state 07/04/2014   Tinea versicolor 07/04/2014   Feeling bilious 01/16/2014   Abdominal pain 12/19/2013   Acid reflux 12/19/2013    History reviewed. No pertinent surgical history.     History reviewed. No pertinent family history.  Social History   Tobacco Use   Smoking status: Never   Smokeless tobacco: Never  Substance Use Topics   Alcohol use: Yes    Comment: occ   Drug use: No    Home Medications Prior to Admission medications   Medication Sig Start Date End Date Taking? Authorizing Provider  ondansetron (ZOFRAN ODT) 4 MG disintegrating tablet Take 1 tablet (4 mg total) by mouth every 8 (eight) hours  as needed for nausea or vomiting. 06/17/21  Yes Jeannie Fend, PA-C  ALPRAZolam Prudy Feeler) 0.5 MG tablet Take 1 tablet (0.5 mg total) by mouth 2 (two) times daily as needed for anxiety. 01/01/17   Waldon Merl, PA-C  celecoxib (CELEBREX) 100 MG capsule Take 1 capsule (100 mg total) by mouth daily. 12/16/16   Sheliah Hatch, MD  cyclobenzaprine (FLEXERIL) 10 MG tablet Take 1 tablet (10 mg total) by mouth at bedtime. 07/04/15   Sheliah Hatch, MD  dexlansoprazole (DEXILANT) 60 MG capsule Take 1 capsule (60 mg total) by mouth daily. 06/02/16   Waldon Merl, PA-C  dicyclomine (BENTYL) 10 MG capsule Take 1 capsule (10 mg total) by mouth 3 (three) times daily before meals. 06/02/16   Waldon Merl, PA-C  famotidine (PEPCID) 40 MG tablet Take 1 tablet (40 mg total) by mouth at bedtime. 10/14/16   Waldon Merl, PA-C  FLUoxetine (PROZAC) 20 MG tablet Take 1 tablet (20 mg total) by mouth daily. 06/03/15   Sheliah Hatch, MD  ranitidine (ZANTAC) 300 MG tablet Take 300 mg by mouth at bedtime.    [provider]  sucralfate (CARAFATE) 1 g tablet Take 1 g by mouth 4 (four) times daily -  with meals and at bedtime.    [provider]  traZODone (DESYREL) 50 MG tablet Take 0.5-1 tablets (25-50 mg total) by mouth at bedtime as needed for sleep. 06/02/16  Waldon Merl, PA-C    Allergies    Patient has no known allergies.  Review of Systems   Review of Systems  Constitutional:  Negative for chills, diaphoresis and fever.  Respiratory:  Negative for shortness of breath.   Cardiovascular:  Positive for chest pain. Negative for palpitations and leg swelling.  Gastrointestinal:  Positive for nausea and vomiting. Negative for abdominal pain, blood in stool, constipation and diarrhea.  Genitourinary:  Negative for difficulty urinating.  Musculoskeletal:  Negative for arthralgias, back pain and myalgias.  Skin:  Negative for rash and wound.  Allergic/Immunologic:  Negative for immunocompromised state.  Neurological:  Positive for light-headedness. Negative for weakness.  Hematological:  Negative for adenopathy. Does not bruise/bleed easily.  All other systems reviewed and are negative.  Physical Exam Updated Vital Signs BP (!) 130/93 (BP Location: Right Arm)   Pulse (!) 59   Temp 98.2 F (36.8 C) (Oral)   Resp 16   Ht 5\' 6"  (1.676 m)   Wt 68 kg   SpO2 96%   BMI 24.21 kg/m   Physical Exam Vitals and nursing note reviewed.  Constitutional:      General: He is not in acute distress.    Appearance: He is well-developed. He is not diaphoretic.  HENT:     Head: Normocephalic and atraumatic.  Cardiovascular:     Rate and Rhythm: Normal rate and regular rhythm.     Heart sounds: Normal heart sounds.  Pulmonary:     Effort: Pulmonary effort is normal.     Breath sounds: Normal breath sounds.  Chest:     Chest wall: No tenderness.  Abdominal:     Palpations: Abdomen is soft.     Tenderness: There is no abdominal tenderness.  Musculoskeletal:     Cervical back: Normal range of motion and neck supple.     Right lower leg: No tenderness. No edema.     Left lower leg: No tenderness. No edema.  Skin:    General: Skin is warm and dry.     Coloration: Skin is not pale.  Neurological:     Mental Status: He is alert and oriented to person, place, and time.  Psychiatric:        Behavior: Behavior normal.    ED Results / Procedures / Treatments   Labs (all labs ordered are listed, but only abnormal results are displayed) Labs Reviewed  BASIC METABOLIC PANEL - Abnormal; Notable for the following components:      Result Value   Calcium 8.8 (*)    Anion gap 3 (*)    All other components within normal limits  CBC  LIPASE, BLOOD  TROPONIN I (HIGH SENSITIVITY)    EKG None  Radiology DG Chest 2 View  Result Date: 06/17/2021 CLINICAL DATA:  Chest pain EXAM: CHEST - 2 VIEW COMPARISON:  None. FINDINGS: The heart size and mediastinal  contours are within normal limits.No focal airspace disease. No pleural effusion or pneumothorax.No acute osseous abnormality. IMPRESSION: No evidence of acute cardiopulmonary disease. Electronically Signed   By: 06/19/2021   On: 06/17/2021 12:07    Procedures Procedures   Medications Ordered in ED Medications - No data to display  ED Course  I have reviewed the triage vital signs and the nursing notes.  Pertinent labs & imaging results that were available during my care of the patient were reviewed by me and considered in my medical decision making (see chart for details).  Clinical Course as of  06/17/21 1558  Tue Jun 17, 2021  [id number] 35 year old male with complaint as above with concern for bright red blood in emesis earlier today. Vitals without significant changes including O2 sat 100% on room air.  CBC is unremarkable, BMP without significant findings.  Troponin is 2, EKG without ischemic changes, chest x-ray unremarkable, lipase within normal limits. Patient is not orthostatic. Works in [employer], discussed lightheadedness may be due to dehydration and recommend hydrating to urine light yellow status.  Avoid caffeine.  Follow-up with his care team as planned. Regards to his bloody emesis today, recommend increasing his Prilosec to twice daily and following up with his GI provider. [LM]    Clinical Course User Index [LM] Alden Hipp   MDM Rules/Calculators/A&P                           Final Clinical Impression(s) / ED Diagnoses Final diagnoses:  Hematemesis with nausea  Light-headedness  Atypical chest pain    Rx / DC Orders ED Discharge Orders          Ordered    ondansetron (ZOFRAN ODT) 4 MG disintegrating tablet  Every 8 hours PRN        06/17/21 1537             Jeannie Fend, PA-C 06/17/21 1558    Arby Barrette, MD 06/19/21 [phone]

## 2021-06-17 NOTE — Discharge Instructions (Addendum)
Follow up with your GI provider, call today to schedule an appointment.  Take your Prilosec twice daily. Zoran as needed as prescribed for nausea and vomiting.  Hydrate as discussed- urine should be a light yellow.  Avoid caffeinated beverages which can worsen dehydration.

## 2021-06-17 NOTE — ED Triage Notes (Signed)
Pt c/o chest pain & vomiting intermittently for several months. States this morning started having chest pain, dizziness, vomited blood, and had near syncope.

## 2021-09-01 ENCOUNTER — Encounter (HOSPITAL_BASED_OUTPATIENT_CLINIC_OR_DEPARTMENT_OTHER): Payer: Self-pay

## 2021-09-01 ENCOUNTER — Emergency Department (HOSPITAL_BASED_OUTPATIENT_CLINIC_OR_DEPARTMENT_OTHER): Payer: Self-pay

## 2021-09-01 ENCOUNTER — Emergency Department (HOSPITAL_BASED_OUTPATIENT_CLINIC_OR_DEPARTMENT_OTHER)
Admission: EM | Admit: 2021-09-01 | Discharge: 2021-09-01 | Disposition: A | Discharge status: Home / Self Care | Payer: Self-pay | Attending: Student | Admitting: Student

## 2021-09-01 ENCOUNTER — Other Ambulatory Visit: Payer: Self-pay

## 2021-09-01 DIAGNOSIS — R109 Unspecified abdominal pain: Secondary | ICD-10-CM | POA: Insufficient documentation

## 2021-09-01 DIAGNOSIS — K920 Hematemesis: Secondary | ICD-10-CM | POA: Insufficient documentation

## 2021-09-01 LAB — COMPREHENSIVE METABOLIC PANEL
ALT: 20 U/L (ref 0–44)
AST: 18 U/L (ref 15–41)
Albumin: 4.3 g/dL (ref 3.5–5.0)
Alkaline Phosphatase: 51 U/L (ref 38–126)
Anion gap: 8 (ref 5–15)
BUN: 16 mg/dL (ref 6–20)
CO2: 26 mmol/L (ref 22–32)
Calcium: 8.9 mg/dL (ref 8.9–10.3)
Chloride: 106 mmol/L (ref 98–111)
Creatinine, Ser: 1.11 mg/dL (ref 0.61–1.24)
GFR, Estimated: >60 mL/min (ref >60)
Glucose, Bld: 91 mg/dL (ref 70–99)
Potassium: 4.3 mmol/L (ref 3.5–5.1)
Sodium: 140 mmol/L (ref 135–145)
Total Bilirubin: 1.2 mg/dL (ref 0.3–1.2)
Total Protein: 7.1 g/dL (ref 6.5–8.1)

## 2021-09-01 LAB — CBC WITH DIFFERENTIAL/PLATELET
Abs Immature Granulocytes: 0.02 10*3/uL (ref 0.00–0.07)
Basophils Absolute: 0 10*3/uL (ref 0.0–0.1)
Basophils Relative: 1 %
Eosinophils Absolute: 0.1 10*3/uL (ref 0.0–0.5)
Eosinophils Relative: 1 %
HCT: 40.2 % (ref 39.0–52.0)
Hemoglobin: 14.2 g/dL (ref 13.0–17.0)
Immature Granulocytes: 0 %
Lymphocytes Relative: 21 %
Lymphs Abs: 1.4 10*3/uL (ref 0.7–4.0)
MCH: 31.3 pg (ref 26.0–34.0)
MCHC: 35.3 g/dL (ref 30.0–36.0)
MCV: 88.7 fL (ref 80.0–100.0)
Monocytes Absolute: 0.4 10*3/uL (ref 0.1–1.0)
Monocytes Relative: 6 %
Neutro Abs: 4.7 10*3/uL (ref 1.7–7.7)
Neutrophils Relative %: 71 %
Platelets: 204 10*3/uL (ref 150–400)
RBC: 4.53 MIL/uL (ref 4.22–5.81)
RDW: 13.2 % (ref 11.5–15.5)
WBC: 6.6 10*3/uL (ref 4.0–10.5)
nRBC: 0 % (ref 0.0–0.2)

## 2021-09-01 LAB — LIPASE, BLOOD: Lipase: 38 U/L (ref 11–51)

## 2021-09-01 MED ORDER — LACTATED RINGERS IV BOLUS
1000.0000 mL | Freq: Once | INTRAVENOUS | Status: AC
  Administered 2021-09-01: 1000 mL via INTRAVENOUS

## 2021-09-01 MED ORDER — ONDANSETRON HCL 4 MG/2ML IJ SOLN
4.0000 mg | Freq: Once | INTRAMUSCULAR | Status: AC
  Administered 2021-09-01: 4 mg via INTRAVENOUS
  Filled 2021-09-01: qty 2

## 2021-09-01 MED ORDER — ONDANSETRON 4 MG PO TBDP: 4.0000 mg | ORAL_TABLET | Freq: Three times a day (TID) | ORAL | 0 refills | Status: AC | PRN

## 2021-09-01 MED ORDER — IOHEXOL 300 MG/ML  SOLN
100.0000 mL | Freq: Once | INTRAMUSCULAR | Status: AC | PRN
  Administered 2021-09-01: 100 mL via INTRAVENOUS

## 2021-09-01 NOTE — ED Triage Notes (Signed)
Pt c/o vomiting daily x 3 weeks. States hematemesis x 1 week. States feels increasingly weak. States unable to tolerate PO.

## 2021-09-01 NOTE — ED Notes (Signed)
Pt NAD, a/ox4. Pt verbalizes understanding of all DC and f/u instructions. All questions answered. Pt walks with steady gait to lobby at DC.  ? ?

## 2021-09-01 NOTE — ED Notes (Signed)
Intermittent vomiting x 3 weeks, patient reports vomiting in the morning, usually gets better in the afternoon and he is able to eat..  currently patient is vomiting blood and unable to tolerate PO.  Patient has a long history of GI issues.

## 2021-09-01 NOTE — ED Provider Notes (Addendum)
[facility] EMERGENCY DEPARTMENT Provider Note   CSN: [id number] Arrival date & time: 09/01/21  1046     History Chief Complaint  Patient presents with   Hematemesis    Tanner Wallace is a 35 y.o. male.  With PMH suspected dysautonomia, recurrent nausea and vomiting now with a Nissen fundoplication who presents emergency department for evaluation of hematemesis.  Patient states he has been intermittently vomiting over the last 3 weeks and is unable to tolerate p.o.  Patient states that he recently had a tilt table test with cardiology but is unsure of his results.  He arrives alert and oriented answering all questions appropriately with no complaints of abdominal pain, chest pain, shortness of breath, headache, cough, fever or other systemic symptoms.  He endorses vomiting with small streaks of blood, no coffee-ground emesis.  HPI     Past Medical History:  Diagnosis Date   Acid reflux    Anxiety    GERD (gastroesophageal reflux disease)    History of chicken pox    IBS (irritable bowel syndrome)    Panic attacks     Patient Active Problem List   Diagnosis Date Noted   Fatigue 01/06/2016   Hematemesis 01/06/2016   Strain of abdominal muscle 07/04/2015   Gastritis and gastroduodenitis 06/03/2015   Physical exam 01/04/2015   Anxiety state 07/04/2014   Tinea versicolor 07/04/2014   Feeling bilious 01/16/2014   Abdominal pain 12/19/2013   Acid reflux 12/19/2013    History reviewed. No pertinent surgical history.     History reviewed. No pertinent family history.  Social History   Tobacco Use   Smoking status: Never   Smokeless tobacco: Never  Substance Use Topics   Alcohol use: Yes    Comment: occ   Drug use: No    Home Medications Prior to Admission medications   Medication Sig Start Date End Date Taking? Authorizing Provider  ALPRAZolam Prudy Feeler) 0.5 MG tablet Take 1 tablet (0.5 mg total) by mouth 2 (two) times daily as needed for anxiety. 01/01/17    Waldon Merl, PA-C  celecoxib (CELEBREX) 100 MG capsule Take 1 capsule (100 mg total) by mouth daily. 12/16/16   Sheliah Hatch, MD  cyclobenzaprine (FLEXERIL) 10 MG tablet Take 1 tablet (10 mg total) by mouth at bedtime. 07/04/15   Sheliah Hatch, MD  dexlansoprazole (DEXILANT) 60 MG capsule Take 1 capsule (60 mg total) by mouth daily. 06/02/16   Waldon Merl, PA-C  dicyclomine (BENTYL) 10 MG capsule Take 1 capsule (10 mg total) by mouth 3 (three) times daily before meals. 06/02/16   Waldon Merl, PA-C  famotidine (PEPCID) 40 MG tablet Take 1 tablet (40 mg total) by mouth at bedtime. 10/14/16   Waldon Merl, PA-C  FLUoxetine (PROZAC) 20 MG tablet Take 1 tablet (20 mg total) by mouth daily. 06/03/15   Sheliah Hatch, MD  ondansetron (ZOFRAN ODT) 4 MG disintegrating tablet Take 1 tablet (4 mg total) by mouth every 8 (eight) hours as needed for nausea or vomiting. 09/01/21   Blanch Stang, MD  ranitidine (ZANTAC) 300 MG tablet Take 300 mg by mouth at bedtime.    [provider]  sucralfate (CARAFATE) 1 g tablet Take 1 g by mouth 4 (four) times daily -  with meals and at bedtime.    [provider]  traZODone (DESYREL) 50 MG tablet Take 0.5-1 tablets (25-50 mg total) by mouth at bedtime as needed for sleep. 06/02/16   Daphine Deutscher,  Kristine Garbe, PA-C    Allergies    Patient has no known allergies.  Review of Systems   Review of Systems  Constitutional:  Negative for chills and fever.  HENT:  Negative for ear pain and sore throat.   Eyes:  Negative for pain and visual disturbance.  Respiratory:  Negative for cough and shortness of breath.   Cardiovascular:  Negative for chest pain and palpitations.  Gastrointestinal:  Positive for nausea and vomiting. Negative for abdominal pain.  Genitourinary:  Negative for dysuria and hematuria.  Musculoskeletal:  Negative for arthralgias and back pain.  Skin:  Negative for color change and rash.  Neurological:   Negative for seizures and syncope.  All other systems reviewed and are negative.  Physical Exam Updated Vital Signs BP (!) 128/97   Pulse 73   Temp 98.5 F (36.9 C) (Oral)   Resp 13   Ht 5\' 6"  (1.676 m)   Wt 70.3 kg   SpO2 100%   BMI 25.02 kg/m   Physical Exam Vitals and nursing note reviewed.  Constitutional:      Appearance: He is well-developed.  HENT:     Head: Normocephalic and atraumatic.  Eyes:     Conjunctiva/sclera: Conjunctivae normal.  Cardiovascular:     Rate and Rhythm: Normal rate and regular rhythm.     Heart sounds: No murmur heard. Pulmonary:     Effort: Pulmonary effort is normal. No respiratory distress.     Breath sounds: Normal breath sounds.  Abdominal:     Palpations: Abdomen is soft.     Tenderness: There is no abdominal tenderness.  Musculoskeletal:     Cervical back: Neck supple.  Skin:    General: Skin is warm and dry.  Neurological:     Mental Status: He is alert.    ED Results / Procedures / Treatments   Labs (all labs ordered are listed, but only abnormal results are displayed) Labs Reviewed  CBC WITH DIFFERENTIAL/PLATELET  COMPREHENSIVE METABOLIC PANEL  LIPASE, BLOOD  URINALYSIS, ROUTINE W REFLEX MICROSCOPIC    EKG None  Radiology CT Chest W Contrast  Result Date: 09/01/2021 CLINICAL DATA:  Hematemesis. Intermittent vomiting with abdominal pain for 3 weeks. Nissen fundoplication. Query leak or complicating feature. EXAM: CT CHEST AND ABDOMEN WITH CONTRAST TECHNIQUE: Multidetector CT imaging of the chest and abdomen was performed following the standard protocol during bolus administration of intravenous contrast. CONTRAST:  11/01/2021 OMNIPAQUE IOHEXOL 300 MG/ML  SOLN COMPARISON:  CT scan 09/15/2017 and esophagram 09/04/2019 FINDINGS: CT CHEST FINDINGS Cardiovascular: Unremarkable Mediastinum/Nodes: Unremarkable. No esophageal abnormality observed. No intramural hematoma along the esophagus is observed. No pneumomediastinum.  Lungs/Pleura: Unremarkable Musculoskeletal: Mild gynecomastia. No significant bony abnormality identified. CT ABDOMEN FINDINGS Hepatobiliary: Unremarkable Pancreas: Unremarkable Spleen: Unremarkable Adrenals/Urinary Tract: Unremarkable Stomach/Bowel: A fundoplication wrap is observed extending around the gastroesophageal junction with a metal clip along the margin of the wrap. No findings of abscess or abnormal fluid collection in this vicinity; no observed extraluminal gas. Most of the stomach is relatively nondistended, no additional gastric findings are observed on today's CT. No dilated upper abdominal bowel. Vascular/Lymphatic: Unremarkable Other: No supplemental non-categorized findings. Musculoskeletal: Unremarkable IMPRESSION: 1. A fully intra-abdominal fundoplication wrap is observed extending around the gastroesophageal junction. No obvious abnormal extraluminal gas or fluid is identified; an obvious site of bleeding in this region is not observed. No obvious extraluminal gas, abscess, or intramural hematoma. If there is high clinical suspicion for Mallory-Weiss tear, endoscopy may be warranted. Electronically Signed  By: Gaylyn Rong M.D.   On: 09/01/2021 14:34   CT ABDOMEN W CONTRAST  Result Date: 09/01/2021 CLINICAL DATA:  Hematemesis. Intermittent vomiting with abdominal pain for 3 weeks. Nissen fundoplication. Query leak or complicating feature. EXAM: CT CHEST AND ABDOMEN WITH CONTRAST TECHNIQUE: Multidetector CT imaging of the chest and abdomen was performed following the standard protocol during bolus administration of intravenous contrast. CONTRAST:  OMNIPAQUE IOHEXOL 300 MG/ML  SOLN COMPARISON:  CT scan 09/15/2017 and esophagram 09/04/2019 FINDINGS: CT CHEST FINDINGS Cardiovascular: Unremarkable Mediastinum/Nodes: Unremarkable. No esophageal abnormality observed. No intramural hematoma along the esophagus is observed. No pneumomediastinum. Lungs/Pleura: Unremarkable  Musculoskeletal: Mild gynecomastia. No significant bony abnormality identified. CT ABDOMEN FINDINGS Hepatobiliary: Unremarkable Pancreas: Unremarkable Spleen: Unremarkable Adrenals/Urinary Tract: Unremarkable Stomach/Bowel: A fundoplication wrap is observed extending around the gastroesophageal junction with a metal clip along the margin of the wrap. No findings of abscess or abnormal fluid collection in this vicinity; no observed extraluminal gas. Most of the stomach is relatively nondistended, no additional gastric findings are observed on today's CT. No dilated upper abdominal bowel. Vascular/Lymphatic: Unremarkable Other: No supplemental non-categorized findings. Musculoskeletal: Unremarkable IMPRESSION: 1. A fully intra-abdominal fundoplication wrap is observed extending around the gastroesophageal junction. No obvious abnormal extraluminal gas or fluid is identified; an obvious site of bleeding in this region is not observed. No obvious extraluminal gas, abscess, or intramural hematoma. If there is high clinical suspicion for Mallory-Weiss tear, endoscopy may be warranted. Electronically Signed   By: Gaylyn Rong M.D.   On: 09/01/2021 14:34    Procedures Procedures   Medications Ordered in ED Medications  lactated ringers bolus 1,000 mL (0 mLs Intravenous Stopped 09/01/21 1322)  ondansetron (ZOFRAN) injection 4 mg (4 mg Intravenous Given 09/01/21 1233)  iohexol (OMNIPAQUE) 300 MG/ML solution 100 mL (100 mLs Intravenous Contrast Given 09/01/21 1348)    ED Course  I have reviewed the triage vital signs and the nursing notes.  Pertinent labs & imaging results that were available during my care of the patient were reviewed by me and considered in my medical decision making (see chart for details).    MDM Rules/Calculators/A&P                           Patient seen the emergency department for evaluation of hematemesis.  Physical exam is unremarkable.  Laboratory evaluation is  unremarkable with a normal hemoglobin at 14.2.  A CT of the chest and abdomen with IV and oral contrast was performed due to the patient's history of a Nissen fundoplication to evaluate for possible surgical leak.  These CAT scans were reassuringly negative for this pathology or any acute pathology in the chest or abdomen.  Patient given IV fluid resuscitation and a formula grams IV Zofran and had improvement of symptoms.  Patient presentation consistent with possible Mallory-Weiss tear, and he will need to follow-up with GI to discuss possible repeat endoscopy.  He was able to tolerate p.o. in the emergency department with out difficulty and he was discharged with GI follow-up. Final Clinical Impression(s) / ED Diagnoses Final diagnoses:  Hematemesis with nausea    Rx / DC Orders ED Discharge Orders          Ordered    ondansetron (ZOFRAN ODT) 4 MG disintegrating tablet  Every 8 hours PRN        09/01/21 1501             Dimitrius Steedman, Wyn Forster, MD 09/01/21 1611  Glendora Score, MD 09/01/21 [phone]

## 2021-09-23 ENCOUNTER — Encounter (HOSPITAL_BASED_OUTPATIENT_CLINIC_OR_DEPARTMENT_OTHER): Payer: Self-pay

## 2021-09-23 ENCOUNTER — Other Ambulatory Visit (HOSPITAL_BASED_OUTPATIENT_CLINIC_OR_DEPARTMENT_OTHER): Payer: Self-pay

## 2021-09-23 ENCOUNTER — Emergency Department (HOSPITAL_BASED_OUTPATIENT_CLINIC_OR_DEPARTMENT_OTHER): Payer: Self-pay

## 2021-09-23 ENCOUNTER — Other Ambulatory Visit: Payer: Self-pay

## 2021-09-23 ENCOUNTER — Emergency Department (HOSPITAL_BASED_OUTPATIENT_CLINIC_OR_DEPARTMENT_OTHER)
Admission: EM | Admit: 2021-09-23 | Discharge: 2021-09-23 | Disposition: A | Discharge status: Home / Self Care | Payer: Self-pay | Attending: Emergency Medicine | Admitting: Emergency Medicine

## 2021-09-23 DIAGNOSIS — K5732 Diverticulitis of large intestine without perforation or abscess without bleeding: Secondary | ICD-10-CM | POA: Insufficient documentation

## 2021-09-23 DIAGNOSIS — K219 Gastro-esophageal reflux disease without esophagitis: Secondary | ICD-10-CM | POA: Insufficient documentation

## 2021-09-23 DIAGNOSIS — K5792 Diverticulitis of intestine, part unspecified, without perforation or abscess without bleeding: Secondary | ICD-10-CM

## 2021-09-23 DIAGNOSIS — R112 Nausea with vomiting, unspecified: Secondary | ICD-10-CM

## 2021-09-23 DIAGNOSIS — R1084 Generalized abdominal pain: Secondary | ICD-10-CM

## 2021-09-23 LAB — COMPREHENSIVE METABOLIC PANEL
ALT: 20 U/L (ref 0–44)
AST: 18 U/L (ref 15–41)
Albumin: 4.7 g/dL (ref 3.5–5.0)
Alkaline Phosphatase: 62 U/L (ref 38–126)
Anion gap: 13 (ref 5–15)
BUN: 14 mg/dL (ref 6–20)
CO2: 23 mmol/L (ref 22–32)
Calcium: 9.5 mg/dL (ref 8.9–10.3)
Chloride: 102 mmol/L (ref 98–111)
Creatinine, Ser: 1.1 mg/dL (ref 0.61–1.24)
GFR, Estimated: >60 mL/min (ref >60)
Glucose, Bld: 75 mg/dL (ref 70–99)
Potassium: 4.2 mmol/L (ref 3.5–5.1)
Sodium: 138 mmol/L (ref 135–145)
Total Bilirubin: 2.3 mg/dL — ABNORMAL HIGH (ref 0.3–1.2)
Total Protein: 7.9 g/dL (ref 6.5–8.1)

## 2021-09-23 LAB — CBC WITH DIFFERENTIAL/PLATELET
Abs Immature Granulocytes: 0.02 10*3/uL (ref 0.00–0.07)
Basophils Absolute: 0 10*3/uL (ref 0.0–0.1)
Basophils Relative: 0 %
Eosinophils Absolute: 0.1 10*3/uL (ref 0.0–0.5)
Eosinophils Relative: 1 %
HCT: 46.4 % (ref 39.0–52.0)
Hemoglobin: 16.3 g/dL (ref 13.0–17.0)
Immature Granulocytes: 0 %
Lymphocytes Relative: 19 %
Lymphs Abs: 1.6 10*3/uL (ref 0.7–4.0)
MCH: 30.9 pg (ref 26.0–34.0)
MCHC: 35.1 g/dL (ref 30.0–36.0)
MCV: 87.9 fL (ref 80.0–100.0)
Monocytes Absolute: 0.5 10*3/uL (ref 0.1–1.0)
Monocytes Relative: 6 %
Neutro Abs: 6.2 10*3/uL (ref 1.7–7.7)
Neutrophils Relative %: 74 %
Platelets: 225 10*3/uL (ref 150–400)
RBC: 5.28 MIL/uL (ref 4.22–5.81)
RDW: 13.1 % (ref 11.5–15.5)
WBC: 8.4 10*3/uL (ref 4.0–10.5)
nRBC: 0 % (ref 0.0–0.2)

## 2021-09-23 LAB — LIPASE, BLOOD: Lipase: 37 U/L (ref 11–51)

## 2021-09-23 MED ORDER — SODIUM CHLORIDE 0.9 % IV BOLUS
1000.0000 mL | Freq: Once | INTRAVENOUS | Status: AC
  Administered 2021-09-23: 1000 mL via INTRAVENOUS

## 2021-09-23 MED ORDER — AMOXICILLIN-POT CLAVULANATE 875-125 MG PO TABS
1.0000 | ORAL_TABLET | Freq: Two times a day (BID) | ORAL | 0 refills | Status: AC
  Filled 2021-09-23: qty 20, 10d supply, fill #0

## 2021-09-23 MED ORDER — FAMOTIDINE 40 MG PO TABS
40.0000 mg | ORAL_TABLET | Freq: Every day | ORAL | 0 refills | Status: DC
  Filled 2021-09-23: qty 30, 30d supply, fill #0

## 2021-09-23 MED ORDER — ONDANSETRON HCL 4 MG/2ML IJ SOLN
4.0000 mg | Freq: Once | INTRAMUSCULAR | Status: AC
  Administered 2021-09-23: 4 mg via INTRAVENOUS
  Filled 2021-09-23: qty 2

## 2021-09-23 MED ORDER — SUCRALFATE 1 G PO TABS
1.0000 g | ORAL_TABLET | Freq: Three times a day (TID) | ORAL | 0 refills | Status: AC
  Filled 2021-09-23: qty 56, 14d supply, fill #0

## 2021-09-23 MED ORDER — PANTOPRAZOLE SODIUM 40 MG IV SOLR
40.0000 mg | Freq: Once | INTRAVENOUS | Status: AC
  Administered 2021-09-23: 40 mg via INTRAVENOUS
  Filled 2021-09-23: qty 40

## 2021-09-23 MED ORDER — IOHEXOL 300 MG/ML  SOLN
100.0000 mL | Freq: Once | INTRAMUSCULAR | Status: AC | PRN
  Administered 2021-09-23: 100 mL via INTRAVENOUS

## 2021-09-23 MED ORDER — ONDANSETRON HCL 4 MG PO TABS
4.0000 mg | ORAL_TABLET | Freq: Four times a day (QID) | ORAL | 0 refills | Status: AC
  Filled 2021-09-23: qty 12, 3d supply, fill #0

## 2021-09-23 NOTE — Discharge Instructions (Signed)
Follow-up with your GI doctor.  Restart Carafate, Pepcid and take Zofran as needed for nausea and vomiting.  Take antibiotics as prescribed.

## 2021-09-23 NOTE — ED Notes (Signed)
Pt discharged to home. Discharge instructions have been discussed with patient and/or family members. Pt verbally acknowledges understanding d/c instructions, and endorses comprehension to checkout at registration before leaving.  °

## 2021-09-23 NOTE — ED Notes (Signed)
Patient transported to CT 

## 2021-09-23 NOTE — ED Provider Notes (Signed)
[facility] EMERGENCY DEPARTMENT Provider Note   CSN: [id number] Arrival date & time: 09/23/21  1158     History Chief Complaint  Patient presents with   Emesis   Abdominal Pain    Tanner Wallace is a 35 y.o. male.  The history is provided by the patient.  Emesis Severity:  Moderate Timing:  Intermittent Quality:  Stomach contents Progression:  Improving Chronicity:  Recurrent Relieved by:  Nothing Worsened by:  Nothing Associated symptoms: abdominal pain   Associated symptoms: no arthralgias, no chills, no cough, no diarrhea, no fever, no headaches, no myalgias, no sore throat and no URI   Risk factors: alcohol use   Abdominal Pain Pain location:  Generalized Pain quality: aching   Pain radiates to:  Does not radiate Context: previous surgery   Relieved by:  Nothing Worsened by:  Nothing Associated symptoms: vomiting   Associated symptoms: no chest pain, no chills, no constipation, no cough, no diarrhea, no dysuria, no fever, no hematuria, no nausea, no shortness of breath and no sore throat   Risk factors: multiple surgeries       Past Medical History:  Diagnosis Date   Acid reflux    Anxiety    GERD (gastroesophageal reflux disease)    History of chicken pox    IBS (irritable bowel syndrome)    Panic attacks     Patient Active Problem List   Diagnosis Date Noted   Fatigue 01/06/2016   Hematemesis 01/06/2016   Strain of abdominal muscle 07/04/2015   Gastritis and gastroduodenitis 06/03/2015   Physical exam 01/04/2015   Anxiety state 07/04/2014   Tinea versicolor 07/04/2014   Feeling bilious 01/16/2014   Abdominal pain 12/19/2013   Acid reflux 12/19/2013    History reviewed. No pertinent surgical history.     History reviewed. No pertinent family history.  Social History   Tobacco Use   Smoking status: Never   Smokeless tobacco: Never  Substance Use Topics   Alcohol use: Yes    Comment: occ   Drug use: No    Home  Medications Prior to Admission medications   Medication Sig Start Date End Date Taking? Authorizing Provider  amoxicillin-clavulanate (AUGMENTIN) 875-125 MG tablet Take 1 tablet by mouth 2 (two) times daily for 10 days. 09/23/21 10/03/21 Yes Zuriel Yeaman, DO  ondansetron (ZOFRAN) 4 MG tablet Take 1 tablet (4 mg total) by mouth every 6 (six) hours. 09/23/21  Yes Raelan Burgoon, DO  ALPRAZolam (XANAX) 0.5 MG tablet Take 1 tablet (0.5 mg total) by mouth 2 (two) times daily as needed for anxiety. 01/01/17   Brunetta Jeans, PA-C  celecoxib (CELEBREX) 100 MG capsule Take 1 capsule (100 mg total) by mouth daily. 12/16/16   Midge Minium, MD  cyclobenzaprine (FLEXERIL) 10 MG tablet Take 1 tablet (10 mg total) by mouth at bedtime. 07/04/15   Midge Minium, MD  dexlansoprazole (DEXILANT) 60 MG capsule Take 1 capsule (60 mg total) by mouth daily. 06/02/16   Brunetta Jeans, PA-C  dicyclomine (BENTYL) 10 MG capsule Take 1 capsule (10 mg total) by mouth 3 (three) times daily before meals. 06/02/16   Brunetta Jeans, PA-C  famotidine (PEPCID) 40 MG tablet Take 1 tablet (40 mg total) by mouth at bedtime. 09/23/21 10/23/21  Joseluis Alessio, DO  FLUoxetine (PROZAC) 20 MG tablet Take 1 tablet (20 mg total) by mouth daily. 06/03/15   Midge Minium, MD  ondansetron (ZOFRAN ODT) 4 MG disintegrating tablet Take 1 tablet (  4 mg total) by mouth every 8 (eight) hours as needed for nausea or vomiting. 09/01/21   Kommor, [place], MD  ranitidine (ZANTAC) 300 MG tablet Take 300 mg by mouth at bedtime.    [provider]  sucralfate (CARAFATE) 1 g tablet Take 1 tablet (1 g total) by mouth 4 (four) times daily -  with meals and at bedtime for 14 days. 09/23/21 10/07/21  Kylee Nardozzi, DO  traZODone (DESYREL) 50 MG tablet Take 0.5-1 tablets (25-50 mg total) by mouth at bedtime as needed for sleep. 06/02/16   Waldon Merl, PA-C    Allergies    Patient has no known allergies.  Review of Systems    Review of Systems  Constitutional:  Negative for chills and fever.  HENT:  Negative for ear pain and sore throat.   Eyes:  Negative for pain and visual disturbance.  Respiratory:  Negative for cough and shortness of breath.   Cardiovascular:  Negative for chest pain and palpitations.  Gastrointestinal:  Positive for abdominal pain and vomiting. Negative for abdominal distention, anal bleeding, blood in stool, constipation, diarrhea, nausea and rectal pain.  Genitourinary:  Negative for dysuria and hematuria.  Musculoskeletal:  Negative for arthralgias, back pain and myalgias.  Skin:  Negative for color change and rash.  Neurological:  Negative for seizures, syncope and headaches.  All other systems reviewed and are negative.  Physical Exam Updated Vital Signs  ED Triage Vitals  Enc Vitals Group     BP 09/23/21 1210 (!) 126/95     Pulse Rate 09/23/21 1210 74     Resp 09/23/21 1210 18     Temp 09/23/21 1210 98.3 F (36.8 C)     Temp Source 09/23/21 1210 Oral     SpO2 09/23/21 1210 100 %     Weight 09/23/21 1208 155 lb (70.3 kg)     Height 09/23/21 1208 5\' 6"  (1.676 m)     Head Circumference --      Peak Flow --      Pain Score 09/23/21 1208 7     Pain Loc --      Pain Edu? --      Excl. in GC? --      Physical Exam Vitals and nursing note reviewed.  Constitutional:      General: He is not in acute distress.    Appearance: He is well-developed. He is not ill-appearing.  HENT:     Head: Normocephalic and atraumatic.     Mouth/Throat:     Mouth: Mucous membranes are moist.  Eyes:     Extraocular Movements: Extraocular movements intact.     Conjunctiva/sclera: Conjunctivae normal.     Pupils: Pupils are equal, round, and reactive to light.  Cardiovascular:     Rate and Rhythm: Normal rate and regular rhythm.     Heart sounds: Normal heart sounds. No murmur heard. Pulmonary:     Effort: Pulmonary effort is normal. No respiratory distress.     Breath sounds: Normal  breath sounds.  Abdominal:     General: Abdomen is flat. There is no distension.     Palpations: Abdomen is soft.     Tenderness: There is generalized abdominal tenderness. There is no guarding or rebound.     Hernia: No hernia is present.  Musculoskeletal:     Cervical back: Neck supple.  Skin:    General: Skin is warm and dry.     Capillary Refill: Capillary refill takes less than 2  seconds.  Neurological:     General: No focal deficit present.     Mental Status: He is alert.    ED Results / Procedures / Treatments   Labs (all labs ordered are listed, but only abnormal results are displayed) Labs Reviewed  COMPREHENSIVE METABOLIC PANEL - Abnormal; Notable for the following components:      Result Value   Total Bilirubin 2.3 (*)    All other components within normal limits  CBC WITH DIFFERENTIAL/PLATELET  LIPASE, BLOOD    EKG None  Radiology CT ABDOMEN PELVIS W CONTRAST  Result Date: 09/23/2021 CLINICAL DATA:  Peritonitis or perforation suspected. Vomiting coffee ground emesis EXAM: CT ABDOMEN AND PELVIS WITH CONTRAST TECHNIQUE: Multidetector CT imaging of the abdomen and pelvis was performed using the standard protocol following bolus administration of intravenous contrast. CONTRAST:  115mL OMNIPAQUE IOHEXOL 300 MG/ML  SOLN COMPARISON:  CT 09/01/2021 FINDINGS: Lower chest: Lung bases are clear. Hepatobiliary: No focal hepatic lesion. No biliary duct dilatation. Common bile duct is normal. Pancreas: Pancreas is normal. No ductal dilatation. No pancreatic inflammation. Spleen: Normal spleen Adrenals/urinary tract: Adrenal glands and kidneys are normal. The ureters and bladder normal. Stomach/Bowel: Postsurgical change at the GE junction consistent with fundoplication. Distal esophagus appears normal. Stomach appears normal. No debris within the stomach. No mucosal inflammation identified on CT imaging. Duodenum is normal. Small bowel and terminal ileum normal. Appendix normal. There  is very mild inflammation in the pericolonic fat along the descending colon (image 53/2 and image 54/2). Several diverticula through this region. Rectosigmoid colon normal. Vascular/Lymphatic: Abdominal aorta is normal caliber. No periportal or retroperitoneal adenopathy. No pelvic adenopathy. Reproductive: Prostate normal Other: No intraperitoneal free air.  No intraperitoneal free fluid Musculoskeletal: No aggressive osseous lesion. IMPRESSION: 1. Nissen fundoplication wrap without evidence of complication. 2. No perforation or abscess.  No intraperitoneal free air or fluid. 3. Very mild pericolonic inflammation along the descending colon suggest mild acute diverticulitis. Electronically Signed   By: Suzy Bouchard M.D.   On: 09/23/2021 13:39    Procedures Procedures   Medications Ordered in ED Medications  sodium chloride 0.9 % bolus 1,000 mL (1,000 mLs Intravenous New Bag/Given 09/23/21 1242)  ondansetron (ZOFRAN) injection 4 mg (4 mg Intravenous Given 09/23/21 1242)  pantoprazole (PROTONIX) injection 40 mg (40 mg Intravenous Given 09/23/21 1332)  iohexol (OMNIPAQUE) 300 MG/ML solution 100 mL (100 mLs Intravenous Contrast Given 09/23/21 1258)    ED Course  I have reviewed the triage vital signs and the nursing notes.  Pertinent labs & imaging results that were available during my care of the patient were reviewed by me and considered in my medical decision making (see chart for details).    MDM Rules/Calculators/A&P                           Tanner Wallace is a 36 year old male with history of Nissen fundoplication, gastritis who presents the ED with nausea, vomiting, abdominal pain.  Pain is diffuse.  He has been having intermittent nausea and vomiting and abdominal pain for a while but worse overnight.  Was drinking alcohol this past weekend.  CT scan showed no evidence of perforation but did show some mild diverticulitis.  He had no significant anemia, leukocytosis, electrolyte  abnormality or kidney injury.  He overall feels much better after IV fluids, IV Protonix, IV Zofran.  Suspect gastritis with may be mild diverticulitis as well.  We will restart him on his  reflux meds and Carafate and prescribe antibiotics.  We will have him follow-up with his primary care doctor.  Understands return precautions.  Recommend avoidance of alcohol and other reflux related foods.    This chart was dictated using voice recognition software.  Despite best efforts to proofread,  errors can occur which can change the documentation meaning.   Final Clinical Impression(s) / ED Diagnoses Final diagnoses:  Generalized abdominal pain  Nausea and vomiting, unspecified vomiting type  Diverticulitis    Rx / DC Orders ED Discharge Orders          Ordered    ondansetron (ZOFRAN) 4 MG tablet  Every 6 hours        09/23/21 1413    sucralfate (CARAFATE) 1 g tablet  3 times daily with meals & bedtime        09/23/21 1413    famotidine (PEPCID) 40 MG tablet  Daily at bedtime        09/23/21 1413    amoxicillin-clavulanate (AUGMENTIN) 875-125 MG tablet  2 times daily        09/23/21 1413             Riel Hirschman, [place], DO 09/23/21 1416

## 2021-09-23 NOTE — ED Triage Notes (Signed)
During the night pt had hematemesis. States was "black" like coffee grounds. C/o "severe" mid abdominal pain and fatigue. States unable to tolerate PO.

## 2021-10-22 ENCOUNTER — Other Ambulatory Visit: Payer: Self-pay

## 2021-10-22 ENCOUNTER — Emergency Department (HOSPITAL_BASED_OUTPATIENT_CLINIC_OR_DEPARTMENT_OTHER)
Admission: EM | Admit: 2021-10-22 | Discharge: 2021-10-22 | Disposition: A | Discharge status: Home / Self Care | Payer: Self-pay | Attending: Student | Admitting: Student

## 2021-10-22 ENCOUNTER — Encounter (HOSPITAL_BASED_OUTPATIENT_CLINIC_OR_DEPARTMENT_OTHER): Payer: Self-pay

## 2021-10-22 DIAGNOSIS — K920 Hematemesis: Secondary | ICD-10-CM

## 2021-10-22 DIAGNOSIS — R112 Nausea with vomiting, unspecified: Secondary | ICD-10-CM | POA: Insufficient documentation

## 2021-10-22 DIAGNOSIS — R1013 Epigastric pain: Secondary | ICD-10-CM | POA: Insufficient documentation

## 2021-10-22 DIAGNOSIS — K2971 Gastritis, unspecified, with bleeding: Secondary | ICD-10-CM

## 2021-10-22 LAB — COMPREHENSIVE METABOLIC PANEL
ALT: 22 U/L (ref 0–44)
AST: 18 U/L (ref 15–41)
Albumin: 4.5 g/dL (ref 3.5–5.0)
Alkaline Phosphatase: 52 U/L (ref 38–126)
Anion gap: 8 (ref 5–15)
BUN: 15 mg/dL (ref 6–20)
CO2: 27 mmol/L (ref 22–32)
Calcium: 9.3 mg/dL (ref 8.9–10.3)
Chloride: 102 mmol/L (ref 98–111)
Creatinine, Ser: 0.99 mg/dL (ref 0.61–1.24)
GFR, Estimated: >60 mL/min (ref >60)
Glucose, Bld: 97 mg/dL (ref 70–99)
Potassium: 4.5 mmol/L (ref 3.5–5.1)
Sodium: 137 mmol/L (ref 135–145)
Total Bilirubin: 1.5 mg/dL — ABNORMAL HIGH (ref 0.3–1.2)
Total Protein: 7.3 g/dL (ref 6.5–8.1)

## 2021-10-22 LAB — CBC
HCT: 42.8 % (ref 39.0–52.0)
Hemoglobin: 15.2 g/dL (ref 13.0–17.0)
MCH: 31.2 pg (ref 26.0–34.0)
MCHC: 35.5 g/dL (ref 30.0–36.0)
MCV: 87.9 fL (ref 80.0–100.0)
Platelets: 214 10*3/uL (ref 150–400)
RBC: 4.87 MIL/uL (ref 4.22–5.81)
RDW: 12.9 % (ref 11.5–15.5)
WBC: 6.1 10*3/uL (ref 4.0–10.5)
nRBC: 0 % (ref 0.0–0.2)

## 2021-10-22 LAB — LIPASE, BLOOD: Lipase: 36 U/L (ref 11–51)

## 2021-10-22 MED ORDER — AMOXICILLIN-POT CLAVULANATE 875-125 MG PO TABS
1.0000 | ORAL_TABLET | Freq: Once | ORAL | Status: AC
  Administered 2021-10-22: 1 via ORAL
  Filled 2021-10-22: qty 1

## 2021-10-22 MED ORDER — AMOXICILLIN-POT CLAVULANATE 875-125 MG PO TABS: 1.0000 | ORAL_TABLET | Freq: Two times a day (BID) | ORAL | 0 refills | Status: AC

## 2021-10-22 MED ORDER — HYDROXYZINE HCL 25 MG PO TABS: 25.0000 mg | ORAL_TABLET | Freq: Four times a day (QID) | ORAL | 0 refills | Status: AC

## 2021-10-22 MED ORDER — FAMOTIDINE 20 MG PO TABS: 20.0000 mg | ORAL_TABLET | Freq: Two times a day (BID) | ORAL | 0 refills | Status: AC

## 2021-10-22 MED ORDER — FAMOTIDINE 20 MG PO TABS
20.0000 mg | ORAL_TABLET | Freq: Once | ORAL | Status: AC
  Administered 2021-10-22: 20 mg via ORAL
  Filled 2021-10-22: qty 1

## 2021-10-22 MED ORDER — ALUM & MAG HYDROXIDE-SIMETH 200-200-20 MG/5ML PO SUSP
30.0000 mL | Freq: Once | ORAL | Status: AC
  Administered 2021-10-22: 30 mL via ORAL
  Filled 2021-10-22: qty 30

## 2021-10-22 NOTE — ED Notes (Signed)
Pt ambulatory from triage, no assistance needed.  

## 2021-10-22 NOTE — ED Triage Notes (Signed)
Pt c/o hematemesis, abd pain started this am-reports hx of same-NAD-steady gait

## 2021-10-22 NOTE — ED Notes (Signed)
D/c paperwork reviewed with pt, including prescriptions and f/u care. Pt with no questions or concerns at time of d/c. Ambulatory to ED exit.  

## 2021-10-22 NOTE — ED Notes (Signed)
Pt made aware of UA, unable to urinate at this time.

## 2021-10-22 NOTE — ED Provider Notes (Addendum)
[facility] Provider Note   CSN: [id number] Arrival date & time: 10/22/21  1321     History Chief Complaint  Patient presents with   Hematemesis    Tanner Wallace is a 35 y.o. male PMH suspected dysautonomia, recurrent nausea and vomiting now with a Nissen fundoplication who presents emergency department for evaluation of hematemesis and panic attack.  Patient has been seen 3 times for similar complaints since July and on his last evaluation on 09/23/2021 had a CT scan showing mild uncomplicated diverticulitis where he was discharged with Augmentin.  He states that his symptoms mildly improved after taking the Augmentin, but this morning he was shaking, had a panic attack and started vomiting again with streaks of bright red blood.  He states that she has been unable to follow with GI due to scheduling conflicts and other barriers to care.  He currently does not have a primary care physician and sees no outpatient providers for discussions on anxiety.  He arrives with similar complaints to his visit on 09/23/2021 with epigastric abdominal pain and nausea.  Denies chest pain, shortness of breath, fever or other systemic symptoms.  HPI     Past Medical History:  Diagnosis Date   Acid reflux    Anxiety    GERD (gastroesophageal reflux disease)    History of chicken pox    IBS (irritable bowel syndrome)    Panic attacks     Patient Active Problem List   Diagnosis Date Noted   Fatigue 01/06/2016   Hematemesis 01/06/2016   Strain of abdominal muscle 07/04/2015   Gastritis and gastroduodenitis 06/03/2015   Physical exam 01/04/2015   Anxiety state 07/04/2014   Tinea versicolor 07/04/2014   Feeling bilious 01/16/2014   Abdominal pain 12/19/2013   Acid reflux 12/19/2013    History reviewed. No pertinent surgical history.     No family history on file.  Social History   Tobacco Use   Smoking status: Never   Smokeless tobacco: Never  Vaping Use    Vaping Use: Never used  Substance Use Topics   Alcohol use: Yes    Comment: weekly   Drug use: No    Home Medications Prior to Admission medications   Medication Sig Start Date End Date Taking? Authorizing Provider  ALPRAZolam Duanne Moron) 0.5 MG tablet Take 1 tablet (0.5 mg total) by mouth 2 (two) times daily as needed for anxiety. 01/01/17   Brunetta Jeans, PA-C  celecoxib (CELEBREX) 100 MG capsule Take 1 capsule (100 mg total) by mouth daily. 12/16/16   Midge Minium, MD  cyclobenzaprine (FLEXERIL) 10 MG tablet Take 1 tablet (10 mg total) by mouth at bedtime. 07/04/15   Midge Minium, MD  dexlansoprazole (DEXILANT) 60 MG capsule Take 1 capsule (60 mg total) by mouth daily. 06/02/16   Brunetta Jeans, PA-C  dicyclomine (BENTYL) 10 MG capsule Take 1 capsule (10 mg total) by mouth 3 (three) times daily before meals. 06/02/16   Brunetta Jeans, PA-C  famotidine (PEPCID) 40 MG tablet Take 1 tablet (40 mg total) by mouth at bedtime. 09/23/21 10/23/21  Curatolo, Adam, DO  FLUoxetine (PROZAC) 20 MG tablet Take 1 tablet (20 mg total) by mouth daily. 06/03/15   Midge Minium, MD  ondansetron (ZOFRAN ODT) 4 MG disintegrating tablet Take 1 tablet (4 mg total) by mouth every 8 (eight) hours as needed for nausea or vomiting. 09/01/21   Kharson Rasmusson, MD  ondansetron (ZOFRAN) 4 MG tablet Take  1 tablet (4 mg total) by mouth every 6 (six) hours. 09/23/21   Curatolo, Adam, DO  ranitidine (ZANTAC) 300 MG tablet Take 300 mg by mouth at bedtime.    [provider]  sucralfate (CARAFATE) 1 g tablet Take 1 tablet (1 g total) by mouth 4 (four) times daily -  with meals and at bedtime for 14 days. 09/23/21 10/07/21  Curatolo, Adam, DO  traZODone (DESYREL) 50 MG tablet Take 0.5-1 tablets (25-50 mg total) by mouth at bedtime as needed for sleep. 06/02/16   Brunetta Jeans, PA-C    Allergies    Patient has no known allergies.  Review of Systems   Review of Systems  Constitutional:   Negative for chills and fever.  HENT:  Negative for ear pain and sore throat.   Eyes:  Negative for pain and visual disturbance.  Respiratory:  Negative for cough and shortness of breath.   Cardiovascular:  Negative for chest pain and palpitations.  Gastrointestinal:  Positive for nausea and vomiting. Negative for abdominal pain.  Genitourinary:  Negative for dysuria and hematuria.  Musculoskeletal:  Negative for arthralgias and back pain.  Skin:  Negative for color change and rash.  Neurological:  Negative for seizures and syncope.  All other systems reviewed and are negative.  Physical Exam Updated Vital Signs BP (!) 124/99   Pulse 65   Temp 98.5 F (36.9 C) (Oral)   Resp 17   Ht 5\' 6"  (1.676 m)   Wt 67.6 kg   SpO2 99%   BMI 24.05 kg/m   Physical Exam Vitals and nursing note reviewed.  Constitutional:      General: He is not in acute distress.    Appearance: He is well-developed.  HENT:     Head: Normocephalic and atraumatic.  Eyes:     Conjunctiva/sclera: Conjunctivae normal.  Cardiovascular:     Rate and Rhythm: Normal rate and regular rhythm.     Heart sounds: No murmur heard. Pulmonary:     Effort: Pulmonary effort is normal. No respiratory distress.     Breath sounds: Normal breath sounds.  Abdominal:     Palpations: Abdomen is soft.     Tenderness: There is abdominal tenderness (Epigastric).  Musculoskeletal:        General: No swelling.     Cervical back: Neck supple.  Skin:    General: Skin is warm and dry.     Capillary Refill: Capillary refill takes less than 2 seconds.  Neurological:     Mental Status: He is alert.  Psychiatric:        Mood and Affect: Mood normal.    ED Results / Procedures / Treatments   Labs (all labs ordered are listed, but only abnormal results are displayed) Labs Reviewed  COMPREHENSIVE METABOLIC PANEL - Abnormal; Notable for the following components:      Result Value   Total Bilirubin 1.5 (*)    All other components  within normal limits  LIPASE, BLOOD  CBC  URINALYSIS, ROUTINE W REFLEX MICROSCOPIC    EKG None  Radiology No results found.  Procedures Procedures   Medications Ordered in ED Medications  alum & mag hydroxide-simeth (MAALOX/MYLANTA) 200-200-20 MG/5ML suspension 30 mL (has no administration in time range)  famotidine (PEPCID) tablet 20 mg (has no administration in time range)  amoxicillin-clavulanate (AUGMENTIN) 875-125 MG per tablet 1 tablet (has no administration in time range)    ED Course  I have reviewed the triage vital signs and the nursing  notes.  Pertinent labs & imaging results that were available during my care of the patient were reviewed by me and considered in my medical decision making (see chart for details).  Clinical Course as of 10/22/21 1546  Wed Oct 22, 2021  1521 Dr. Richardine Service;  [MK]    Clinical Course User Index [MK] Nancie Bocanegra, Wyn Forster, MD   MDM Rules/Calculators/A&P                           Patient seen the emergency department for evaluation of abdominal pain, nausea and vomiting.  Physical exam reveals mild epigastric tenderness to palpation but is otherwise unremarkable.  Patient's laboratory evaluation is unremarkable with a normal hemoglobin and no white count.  Patient has had 3 CT scans since July with the exact same presentation as today we used shared decision-making to discuss the ultimate risk of accumulation and radiation from repeated CT scans with a low pretest probability of showing acute pathology.  The patient's presentation is consistent with gastritis and a probable Mallory-Weiss tear, and he will require outpatient GI follow-up.  Patient has had significant barriers to care with GI follow-up, so I called the office of Dr. Gery Pray at [facility] gastroenterology and the patient has a follow-up appointment on 12/8 at 1030 for discussions on repeat endoscopy.  As the patient had diverticulitis with a similar presentation in early  November, we will trial a very short course of Augmentin as this appeared to have helped him last time.  The patient will also establish care with a primary care physician upon leaving the ER today.  Patient had no repeated episodes of hemoptysis in the emergency department.  Patient will also be discharged with a as needed prescription for Atarax to be taken if he feels an impending panic attack is coming on.  He has no evidence of an acute pain attack here in the emergency department today.  He was given Pepcid and a GI cocktail which improved his symptoms here in the emergency department and he was discharged.  As the patient has had 3 ER visits for similar complaints, I asked the patient to discuss with Dr. Gery Pray the need for repeat endoscopy to rule out peptic ulcer disease versus gastritis versus Mallory-Weiss as the source of his hematemesis.  The patient will also need to set up a primary care doctor and if he does not have a structural cause of his recurrent vomiting with hematemesis, untreated anxiety may need to be addressed with his new primary care physician. Final Clinical Impression(s) / ED Diagnoses Final diagnoses:  None    Rx / DC Orders ED Discharge Orders     None        Hanan Mcwilliams, Wyn Forster, MD 10/22/21 1559    Glendora Score, MD 10/22/21 1600    Shain Pauwels, [place], MD 10/22/21 1611

## 2021-10-22 NOTE — ED Notes (Signed)
Pt aware urine sample is needed 

## 2022-01-31 ENCOUNTER — Encounter (HOSPITAL_BASED_OUTPATIENT_CLINIC_OR_DEPARTMENT_OTHER): Payer: Self-pay | Admitting: Emergency Medicine

## 2022-01-31 ENCOUNTER — Emergency Department (HOSPITAL_BASED_OUTPATIENT_CLINIC_OR_DEPARTMENT_OTHER)
Admission: EM | Admit: 2022-01-31 | Discharge: 2022-01-31 | Disposition: A | Discharge status: Home / Self Care | Payer: Self-pay | Attending: Emergency Medicine | Admitting: Emergency Medicine

## 2022-01-31 ENCOUNTER — Other Ambulatory Visit: Payer: Self-pay

## 2022-01-31 DIAGNOSIS — K920 Hematemesis: Secondary | ICD-10-CM | POA: Insufficient documentation

## 2022-01-31 DIAGNOSIS — R1084 Generalized abdominal pain: Secondary | ICD-10-CM | POA: Insufficient documentation

## 2022-01-31 DIAGNOSIS — K921 Melena: Secondary | ICD-10-CM | POA: Insufficient documentation

## 2022-01-31 DIAGNOSIS — R197 Diarrhea, unspecified: Secondary | ICD-10-CM | POA: Insufficient documentation

## 2022-01-31 LAB — CBC
HCT: 43.1 % (ref 39.0–52.0)
Hemoglobin: 15.2 g/dL (ref 13.0–17.0)
MCH: 31.6 pg (ref 26.0–34.0)
MCHC: 35.3 g/dL (ref 30.0–36.0)
MCV: 89.6 fL (ref 80.0–100.0)
Platelets: 280 10*3/uL (ref 150–400)
RBC: 4.81 MIL/uL (ref 4.22–5.81)
RDW: 13.5 % (ref 11.5–15.5)
WBC: 6.3 10*3/uL (ref 4.0–10.5)
nRBC: 0 % (ref 0.0–0.2)

## 2022-01-31 LAB — COMPREHENSIVE METABOLIC PANEL
ALT: 21 U/L (ref 0–44)
AST: 17 U/L (ref 15–41)
Albumin: 4.8 g/dL (ref 3.5–5.0)
Alkaline Phosphatase: 63 U/L (ref 38–126)
Anion gap: 10 (ref 5–15)
BUN: 16 mg/dL (ref 6–20)
CO2: 28 mmol/L (ref 22–32)
Calcium: 9.7 mg/dL (ref 8.9–10.3)
Chloride: 102 mmol/L (ref 98–111)
Creatinine, Ser: 1.06 mg/dL (ref 0.61–1.24)
GFR, Estimated: >60 mL/min (ref >60)
Glucose, Bld: 84 mg/dL (ref 70–99)
Potassium: 5 mmol/L (ref 3.5–5.1)
Sodium: 140 mmol/L (ref 135–145)
Total Bilirubin: 1.2 mg/dL (ref 0.3–1.2)
Total Protein: 8.2 g/dL — ABNORMAL HIGH (ref 6.5–8.1)

## 2022-01-31 LAB — URINALYSIS, ROUTINE W REFLEX MICROSCOPIC
Bilirubin Urine: NEGATIVE
Glucose, UA: NEGATIVE mg/dL
Hgb urine dipstick: NEGATIVE
Ketones, ur: NEGATIVE mg/dL
Leukocytes,Ua: NEGATIVE
Nitrite: NEGATIVE
Protein, ur: NEGATIVE mg/dL
Specific Gravity, Urine: 1.025 (ref 1.005–1.030)
pH: 5.5 (ref 5.0–8.0)

## 2022-01-31 LAB — OCCULT BLOOD X 1 CARD TO LAB, STOOL: Fecal Occult Bld: NEGATIVE

## 2022-01-31 LAB — LIPASE, BLOOD: Lipase: 44 U/L (ref 11–51)

## 2022-01-31 MED ORDER — DICYCLOMINE HCL 20 MG PO TABS: 20.0000 mg | ORAL_TABLET | Freq: Two times a day (BID) | ORAL | 0 refills | Status: AC

## 2022-01-31 MED ORDER — SODIUM CHLORIDE 0.9 % IV BOLUS
1000.0000 mL | Freq: Once | INTRAVENOUS | Status: AC
  Administered 2022-01-31: 1000 mL via INTRAVENOUS

## 2022-01-31 MED ORDER — FAMOTIDINE IN NACL 20-0.9 MG/50ML-% IV SOLN
20.0000 mg | Freq: Once | INTRAVENOUS | Status: AC
  Administered 2022-01-31: 20 mg via INTRAVENOUS
  Filled 2022-01-31: qty 50

## 2022-01-31 MED ORDER — ONDANSETRON HCL 4 MG/2ML IJ SOLN
4.0000 mg | Freq: Once | INTRAMUSCULAR | Status: AC
  Administered 2022-01-31: 4 mg via INTRAVENOUS
  Filled 2022-01-31: qty 2

## 2022-01-31 MED ORDER — DICYCLOMINE HCL 10 MG PO CAPS
10.0000 mg | ORAL_CAPSULE | Freq: Once | ORAL | Status: AC
  Administered 2022-01-31: 10 mg via ORAL
  Filled 2022-01-31: qty 1

## 2022-01-31 MED ORDER — MORPHINE SULFATE (PF) 4 MG/ML IV SOLN
4.0000 mg | Freq: Once | INTRAVENOUS | Status: AC
  Administered 2022-01-31: 4 mg via INTRAVENOUS
  Filled 2022-01-31: qty 1

## 2022-01-31 NOTE — ED Notes (Signed)
Pt given water for PO challenge 

## 2022-01-31 NOTE — ED Triage Notes (Signed)
Pt arrives pov, steady gait to triage, with c/o abdominal pain with c/o bloody stool and hematemesis starting last night. Hx of diverticulitis ? ?

## 2022-01-31 NOTE — Discharge Instructions (Signed)
Your continuous reassuring.  Follow-up with GI as planned on Monday.  Continue taking your Prilosec, drink plenty fluids and take Bentyl as needed for pain.  Return if the pain localizes to 1 area of your abdomen or if you are unable to tolerate fluids without vomiting. ?

## 2022-01-31 NOTE — ED Provider Notes (Signed)
[facility] EMERGENCY DEPARTMENT Provider Note   CSN: [id number] Arrival date & time: 01/31/22  1242     History  Chief Complaint  Patient presents with   Blood In Stools    Tanner Wallace is a 36 y.o. male.  HPI  Patient presents with nausea, vomiting and diarrhea x1 and half days.  Started yesterday, denies any new restaurants or antibiotics.  He reports multiple episodes of emesis followed by progressive hematemesis.  Its bright red, less than a tablespoon.  Also endorses bright red blood in the stool this morning.  The abdominal pain is diffuse, is worse with emesis and diarrhea.  He has not tried anything over-the-counter for it yet.  Does report history of diverticulitis, he had a colonoscopy and EGD performed within the last 2 months which are notable for polyps per the patient.  He is followed by GI.  Reports frequent episodes of hematemesis.  Home Medications Prior to Admission medications   Medication Sig Start Date End Date Taking? Authorizing Provider  ALPRAZolam Prudy Feeler(XANAX) 0.5 MG tablet Take 1 tablet (0.5 mg total) by mouth 2 (two) times daily as needed for anxiety. 01/01/17   Waldon MerlMartin, William C, PA-C  celecoxib (CELEBREX) 100 MG capsule Take 1 capsule (100 mg total) by mouth daily. 12/16/16   Sheliah Hatchabori, Katherine E, MD  cyclobenzaprine (FLEXERIL) 10 MG tablet Take 1 tablet (10 mg total) by mouth at bedtime. 07/04/15   Sheliah Hatchabori, Katherine E, MD  dicyclomine (BENTYL) 10 MG capsule Take 1 capsule (10 mg total) by mouth 3 (three) times daily before meals. 06/02/16   Waldon MerlMartin, William C, PA-C  famotidine (PEPCID) 20 MG tablet Take 1 tablet (20 mg total) by mouth 2 (two) times daily. 10/22/21   Kommor, Madison, MD  FLUoxetine (PROZAC) 20 MG tablet Take 1 tablet (20 mg total) by mouth daily. 06/03/15   Sheliah Hatchabori, Katherine E, MD  hydrOXYzine (ATARAX) 25 MG tablet Take 1 tablet (25 mg total) by mouth every 6 (six) hours. 10/22/21   Kommor, Madison, MD  ondansetron (ZOFRAN ODT) 4 MG  disintegrating tablet Take 1 tablet (4 mg total) by mouth every 8 (eight) hours as needed for nausea or vomiting. 09/01/21   Kommor, Madison, MD  ondansetron (ZOFRAN) 4 MG tablet Take 1 tablet (4 mg total) by mouth every 6 (six) hours. 09/23/21   Curatolo, Adam, DO  sucralfate (CARAFATE) 1 g tablet Take 1 tablet (1 g total) by mouth 4 (four) times daily -  with meals and at bedtime for 14 days. 09/23/21 10/07/21  Curatolo, Adam, DO  traZODone (DESYREL) 50 MG tablet Take 0.5-1 tablets (25-50 mg total) by mouth at bedtime as needed for sleep. 06/02/16   Waldon MerlMartin, William C, PA-C      Allergies    Patient has no known allergies.    Review of Systems   Review of Systems  Physical Exam Updated Vital Signs BP (!) 126/95    Pulse 74    Temp 98.2 F (36.8 C) (Oral)    Resp 18    Ht 5\' 6"  (1.676 m)    Wt 70.3 kg    SpO2 100%    BMI 25.02 kg/m  Physical Exam Vitals and nursing note reviewed. Exam conducted with a chaperone present.  Constitutional:      Appearance: Normal appearance.  HENT:     Head: Normocephalic and atraumatic.  Eyes:     General: No scleral icterus.       Right eye: No discharge.  Left eye: No discharge.     Extraocular Movements: Extraocular movements intact.     Pupils: Pupils are equal, round, and reactive to light.  Cardiovascular:     Rate and Rhythm: Normal rate and regular rhythm.     Pulses: Normal pulses.     Heart sounds: Normal heart sounds. No murmur heard.   No friction rub. No gallop.  Pulmonary:     Effort: Pulmonary effort is normal. No respiratory distress.     Breath sounds: Normal breath sounds.  Abdominal:     General: Abdomen is flat. Bowel sounds are normal. There is no distension.     Palpations: Abdomen is soft.     Tenderness: There is abdominal tenderness. There is no guarding.     Comments: Soft, diffuse abdominal tenderness without any rigidity or guarding  Skin:    General: Skin is warm and dry.     Coloration: Skin is not jaundiced.   Neurological:     Mental Status: He is alert. Mental status is at baseline.     Coordination: Coordination normal.    ED Results / Procedures / Treatments   Labs (all labs ordered are listed, but only abnormal results are displayed) Labs Reviewed  COMPREHENSIVE METABOLIC PANEL - Abnormal; Notable for the following components:      Result Value   Total Protein 8.2 (*)    All other components within normal limits  LIPASE, BLOOD  CBC  URINALYSIS, ROUTINE W REFLEX MICROSCOPIC  OCCULT BLOOD X 1 CARD TO LAB, STOOL    EKG None  Radiology No results found.  Procedures Procedures    Medications Ordered in ED Medications  sodium chloride 0.9 % bolus 1,000 mL (0 mLs Intravenous Stopped 01/31/22 1515)  ondansetron (ZOFRAN) injection 4 mg (4 mg Intravenous Given 01/31/22 1430)  morphine (PF) 4 MG/ML injection 4 mg (4 mg Intravenous Given 01/31/22 1430)  famotidine (PEPCID) IVPB 20 mg premix (0 mg Intravenous Stopped 01/31/22 1500)  sodium chloride 0.9 % bolus 1,000 mL (1,000 mLs Intravenous New Bag/Given 01/31/22 1517)  dicyclomine (BENTYL) capsule 10 mg (10 mg Oral Given 01/31/22 1517)    ED Course/ Medical Decision Making/ A&P                           Medical Decision Making Amount and/or Complexity of Data Reviewed Labs: ordered.  Risk Prescription drug management.   This patient presents to the ED for concern of abdominal pain/vomiting/diarrhea/hematemesis/blood in stool, this involves an extensive number of treatment options, and is a complaint that carries with it a high risk of complications and morbidity.  The differential diagnosis includes diverticulitis, perforated diverticulitis, GI bleed, dehydration, sepsis from hemorrhagic shock, other   Additional history obtained:    I reviewed medical chart, he has frequent ED visits for hematemesis and generalized abdominal pain.  Followed by gastroenterology Kathryne Sharper.  Record, noted that patient's most recent CT was  09/23/2021.  Before that he had had a CT of the abdomen 09/01/2021.  He recently colonoscopy and endoscopy per patient but I am unable to visualize those records.    Lab Tests:  I ordered, viewed, and personally interpreted labs.  The pertinent results include:   CBC without any underlying leukocytosis or anemia.  Lipase negative.  CMP without gross electrolyte derangement, AKI, elevated liver transaminitis.  Urine without any proteinuria or ketonuria, additionally no evidence of underlying infection. Fecal occult test is negative.   Medicines ordered and  prescription drug management:  I ordered medication including: She liter fluid bolus, morphine, Zofran, Bentyl  I have reviewed the patients home medicines and have made adjustments as needed   Test Considered:  Consider getting CT of the abdomen however, patient has had 2 CTs with contrast in the last 7 months.  I do not feel it would be beneficial given there is no focal tenderness or peritoneal signs.  He also has a GI visit this upcoming Monday in 2 days.  Discussed with patient who is also in agreement.  We will forego getting CT abdomen at this time.   Reevaluation:  After the interventions noted above, I reevaluated the patient and found patient reports feeling improved.  Tolerating p.o.   Problems addressed / ED Course: Well-appearing 36 year old male presenting due to hematemesis and other complaints.  History sounds like he had a gastroenteritis and a possible Mallory-Weiss tear secondary to emesis episodes.  His fecal occult test is negative, I doubt he is having an acute GI bleed.  Additionally he is not anemic.  There is no gross electrolyte derangement or AKI from dehydration and no signs of reactive ketosis noted.  Do not feel he needs additional work-up at this time, he reports feeling improved after fluids and Bentyl.  He also has close follow-up already with the GI visit scheduled in 2 days.     Social Determinants of  Health:    Disposition:   After consideration of the diagnostic results and the patients response to treatment, I feel that the patent would benefit from GI follow up outpatient.            Final Clinical Impression(s) / ED Diagnoses Final diagnoses:  None    Rx / DC Orders ED Discharge Orders     None         Theron Arista, PA-C 01/31/22 1641    Rolan Bucco, MD 01/31/22 1645

## 2022-03-31 IMAGING — CT CT ABDOMEN W/ CM
3 of 5 series · 16 of 46 positions shown, 18 images · IV contrast (omnipaque)
Comparison: CT scan 09/15/2017 and esophagram 09/04/2019

CLINICAL DATA: Hematemesis. Intermittent vomiting with abdominal
pain for 3 weeks. Nissen fundoplication. Query leak or complicating
feature.

EXAM:
CT CHEST AND ABDOMEN WITH CONTRAST
TECHNIQUE: Multidetector CT imaging of the chest and abdomen was performed
following the standard protocol during bolus administration of
intravenous contrast.
CONTRAST:  100mL OMNIPAQUE IOHEXOL 300 MG/ML  SOLN

[Series 2: cap with 2 · axial · 0.89mm/px · z∈[-424,-64]mm · 9 of 91 slices shown, 11 images]
[im 10/91  soft-tissue]
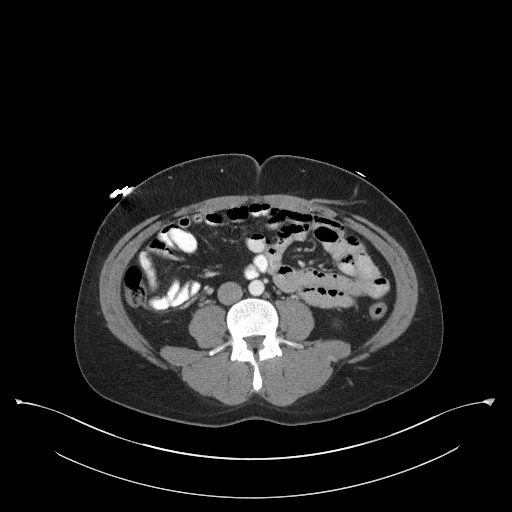
[im 10/91  bone]
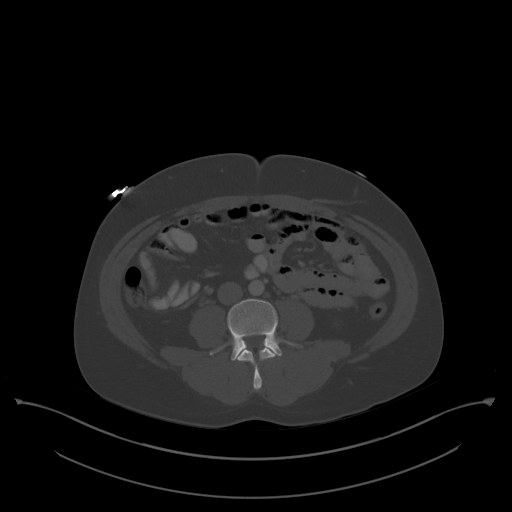
[im 19/91  soft-tissue]
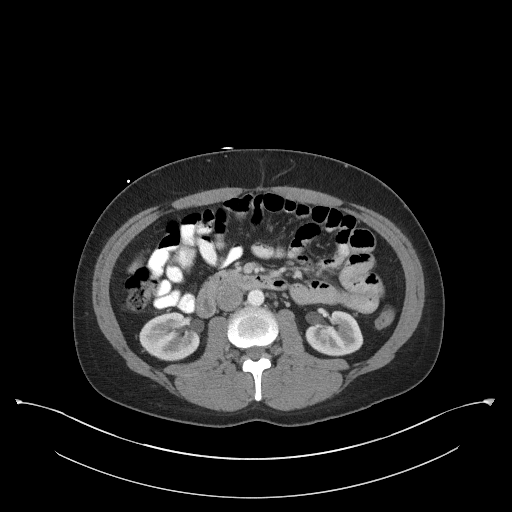
[im 28/91  soft-tissue]
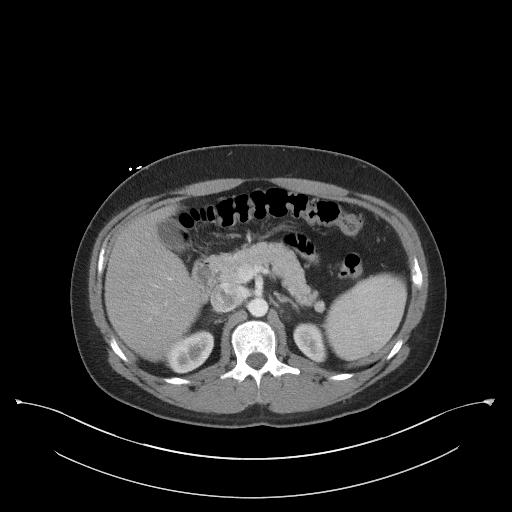
[im 37/91  soft-tissue]
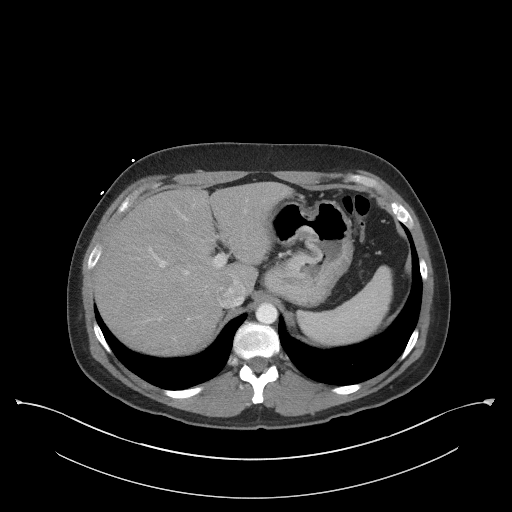
[im 46/91  soft-tissue]
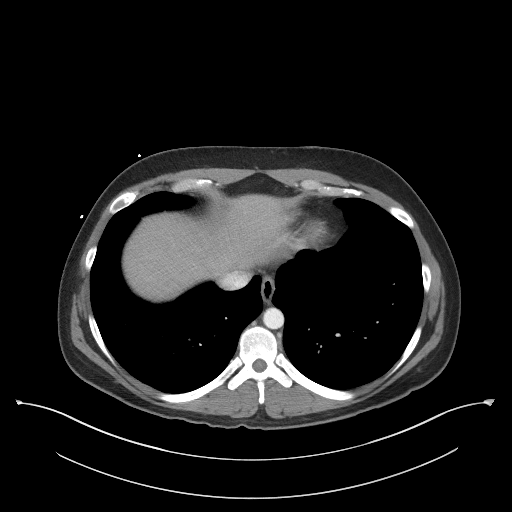
[im 55/91  soft-tissue]
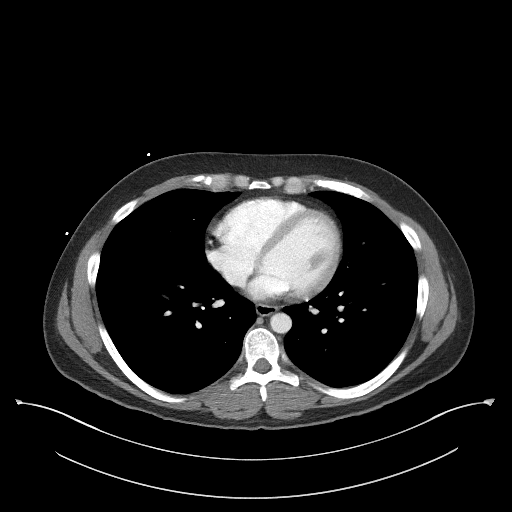
[im 64/91  soft-tissue]
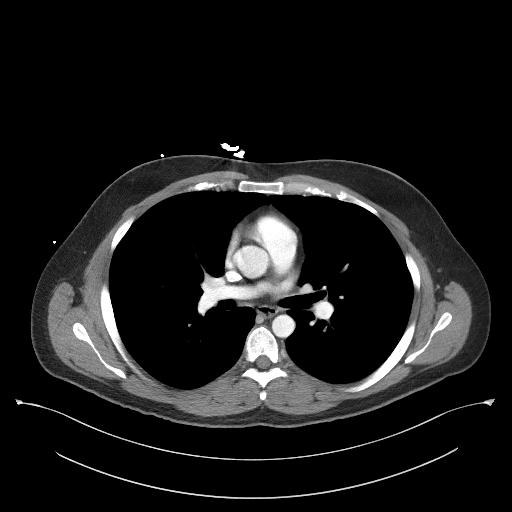
[im 73/91  soft-tissue]
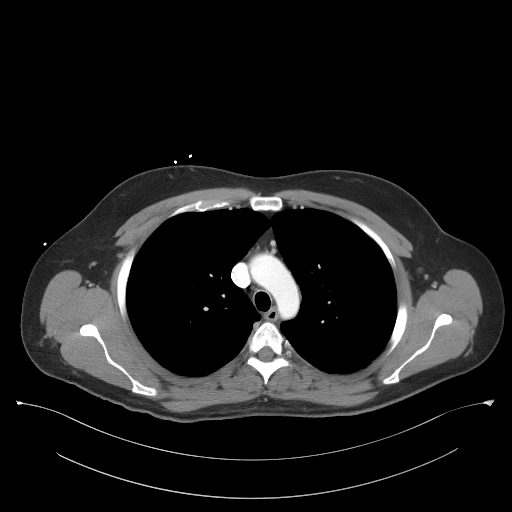
[im 82/91  soft-tissue]
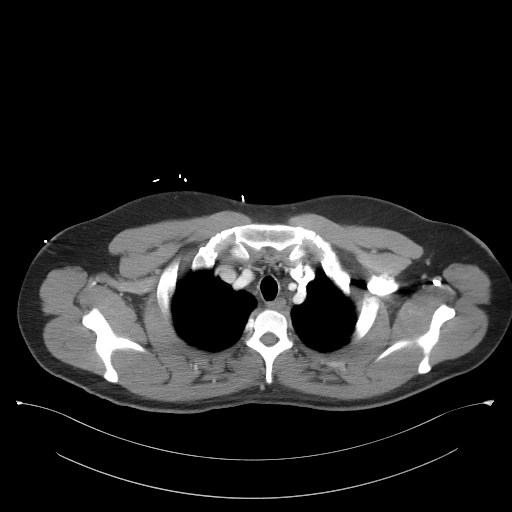
[im 82/91  bone]
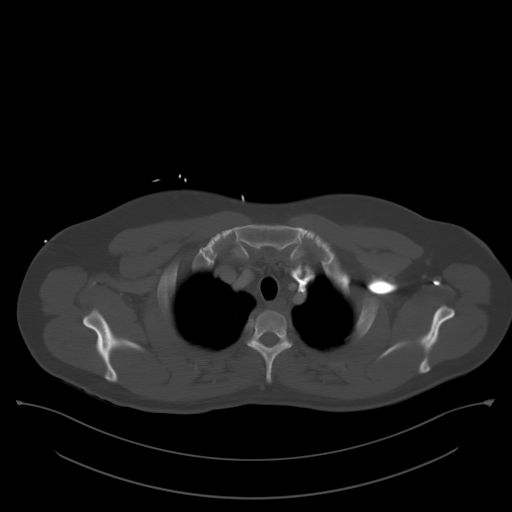

[Series 4: lung · axial · 0.74mm/px · z∈[-305,-191]mm · 4 of 163 slices shown]
[im 20/163  bone]
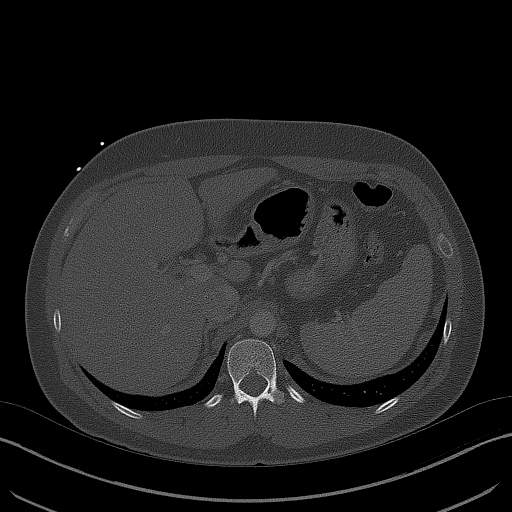
[im 39/163  bone]
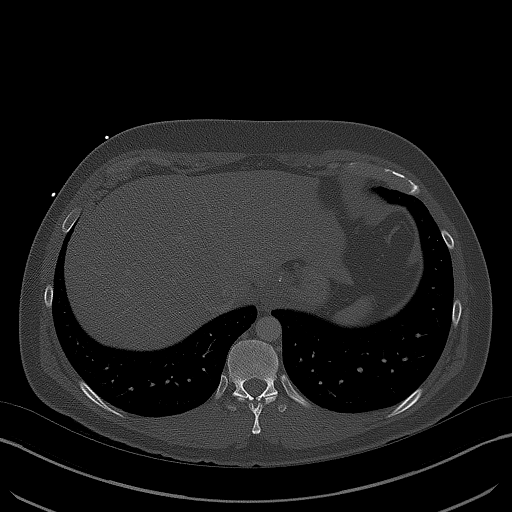
[im 58/163  bone]
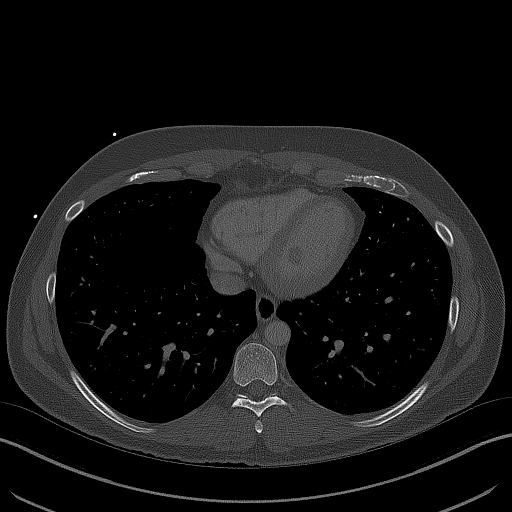
[im 77/163  bone]
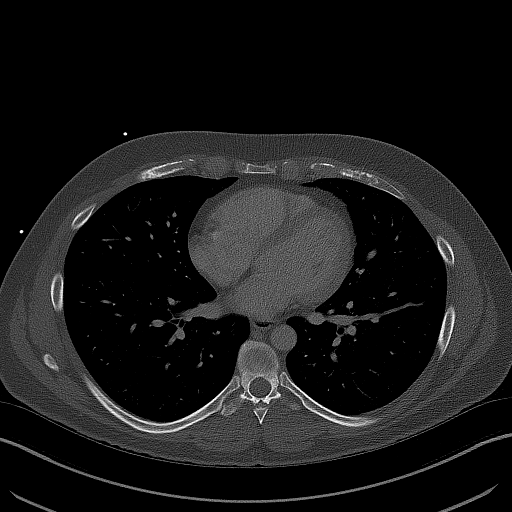

[Series 5: coronals · coronal · 0.77mm/px · 3 of 132 slices shown]
[im 44/132  soft-tissue]
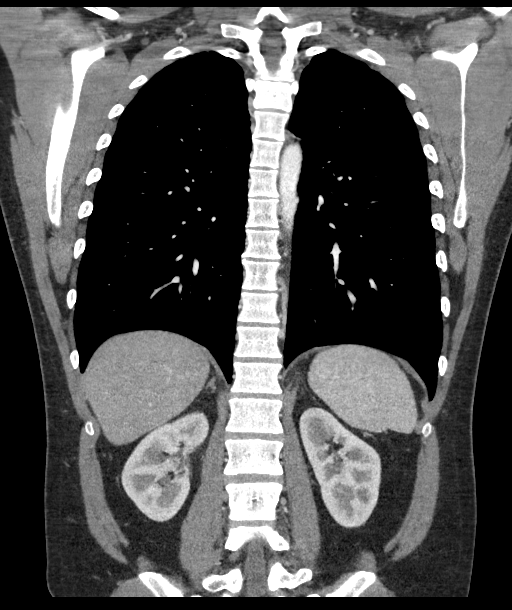
[im 59/132  soft-tissue]
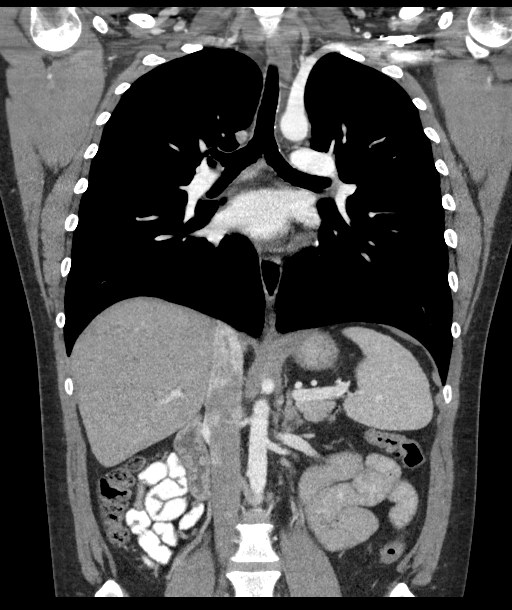
[im 73/132  soft-tissue]
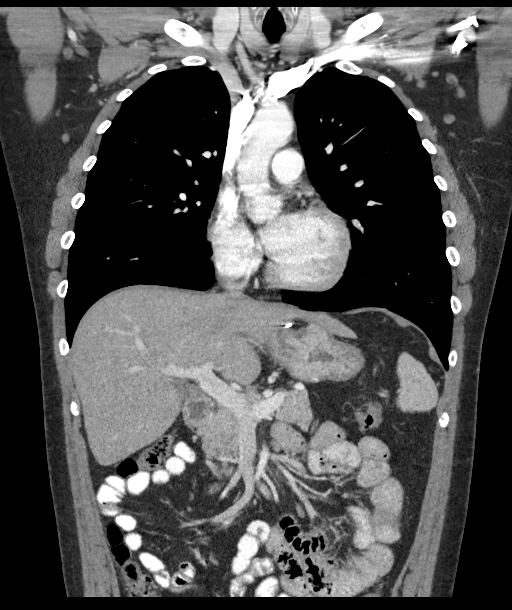

[16 of 46 positions shown; findings below may reference images not displayed]

FINDINGS: CT CHEST FINDINGS

Cardiovascular: Unremarkable

Mediastinum/Nodes: Unremarkable. No esophageal abnormality observed.
No intramural hematoma along the esophagus is observed. No
pneumomediastinum.

Lungs/Pleura: Unremarkable

Musculoskeletal: Mild gynecomastia. No significant bony abnormality
identified.

CT ABDOMEN FINDINGS

Hepatobiliary: Unremarkable

Pancreas: Unremarkable

Spleen: Unremarkable

Adrenals/Urinary Tract: Unremarkable

Stomach/Bowel: A fundoplication wrap is observed extending around
the gastroesophageal junction with a metal clip along the margin of
the wrap. No findings of abscess or abnormal fluid collection in
this vicinity; no observed extraluminal gas. Most of the stomach is
relatively nondistended, no additional gastric findings are observed
on today's CT. No dilated upper abdominal bowel.

Vascular/Lymphatic: Unremarkable

Other: No supplemental non-categorized findings.

Musculoskeletal: Unremarkable
IMPRESSION: 1. A fully intra-abdominal fundoplication wrap is observed extending
around the gastroesophageal junction. No obvious abnormal
extraluminal gas or fluid is identified; an obvious site of bleeding
in this region is not observed. No obvious extraluminal gas,
abscess, or intramural hematoma. If there is high clinical suspicion
for Wirmvem tear, endoscopy may be warranted.

## 2022-03-31 IMAGING — CT CT CHEST W/ CM
3 of 5 series · 16 of 36 positions shown, 18 images · IV contrast (Omnipaque)
Comparison: CT scan 09/15/2017 and esophagram 09/04/2019

CLINICAL DATA: Hematemesis. Intermittent vomiting with abdominal
pain for 3 weeks. Nissen fundoplication. Query leak or complicating
feature.

EXAM:
CT CHEST AND ABDOMEN WITH CONTRAST
TECHNIQUE: Multidetector CT imaging of the chest and abdomen was performed
following the standard protocol during bolus administration of
intravenous contrast.
CONTRAST:  100mL OMNIPAQUE IOHEXOL 300 MG/ML  SOLN

[Series 2: cap with 2 · axial · 0.89mm/px · z∈[-414,-79]mm · 7 of 91 slices shown, 9 images]
[im 12/91  mediastinal]
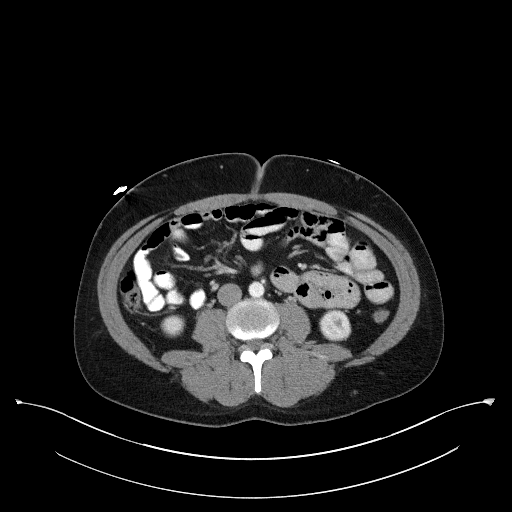
[im 12/91  lung]
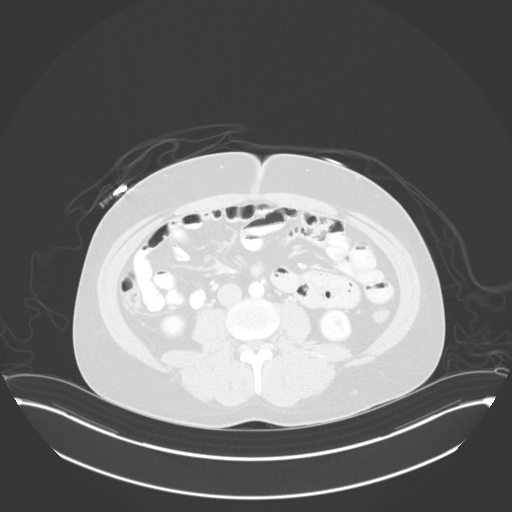
[im 23/91  lung]
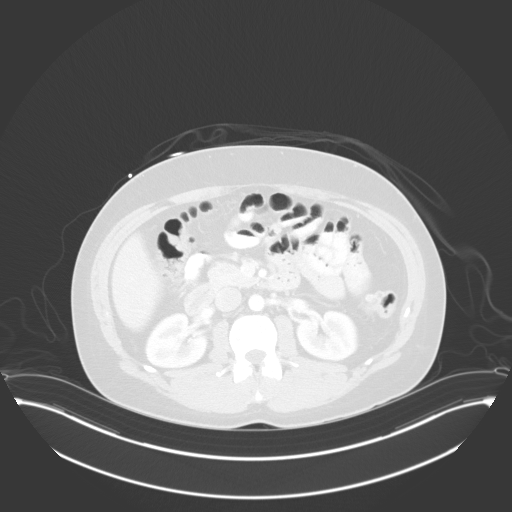
[im 34/91  lung]
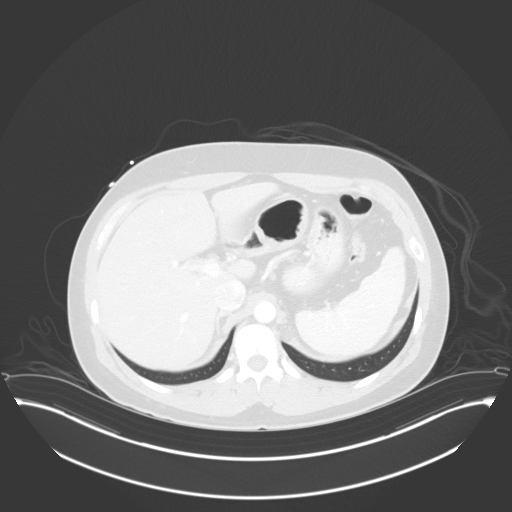
[im 46/91  lung]
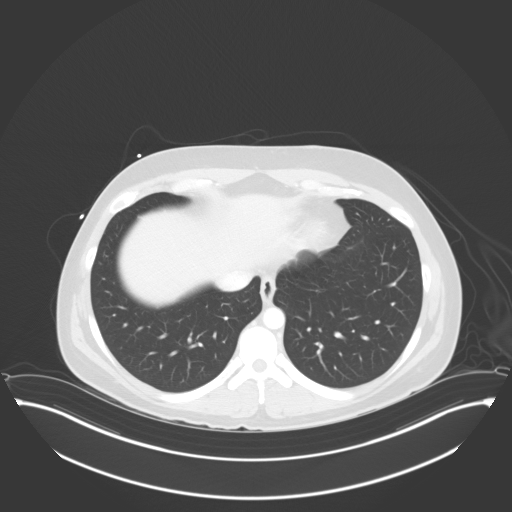
[im 57/91  mediastinal]
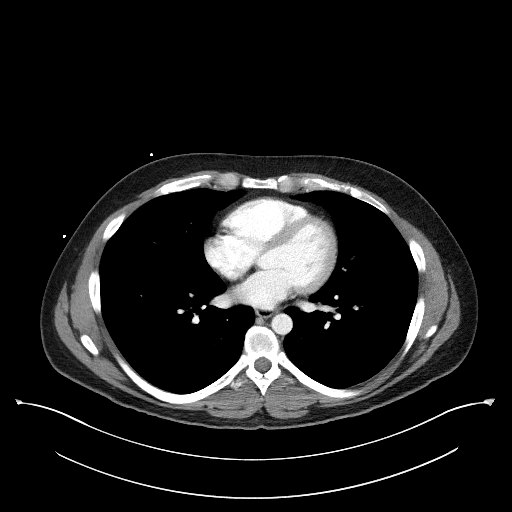
[im 57/91  lung]
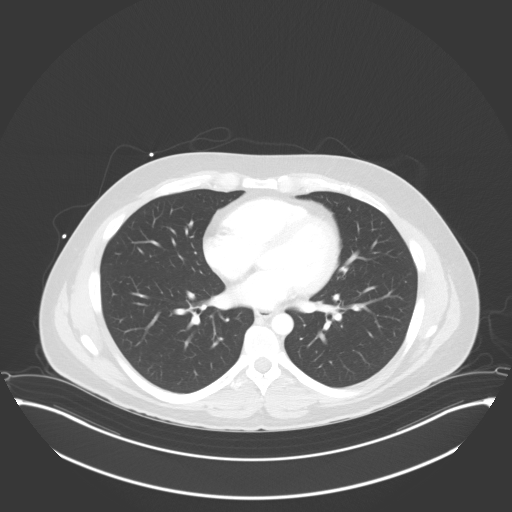
[im 68/91  lung]
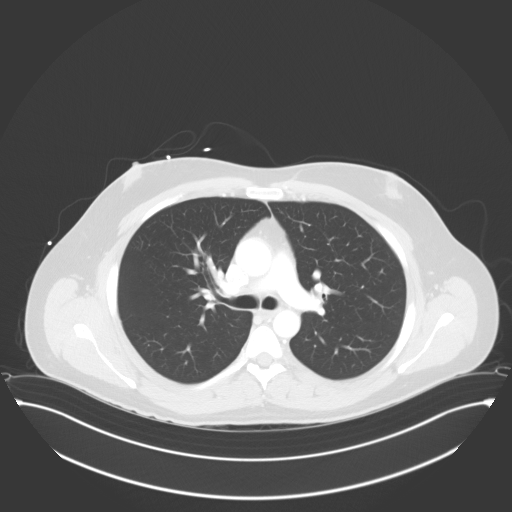
[im 79/91  lung]
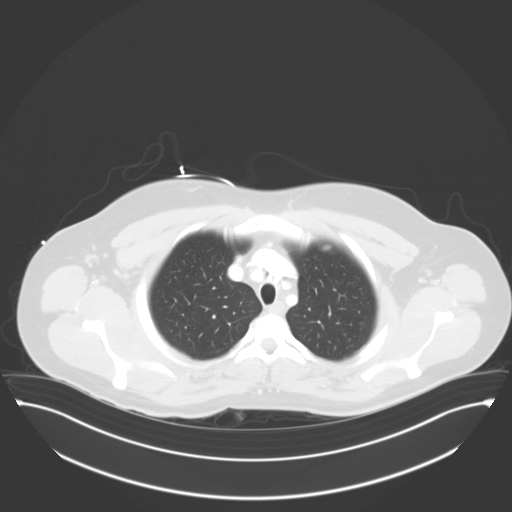

[Series 4: lung · axial · 0.74mm/px · z∈[-323,-127]mm · 6 of 163 slices shown]
[im 11/163  lung]
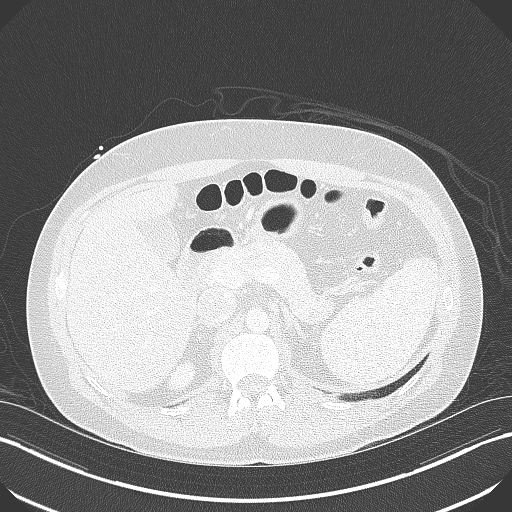
[im 33/163  lung]
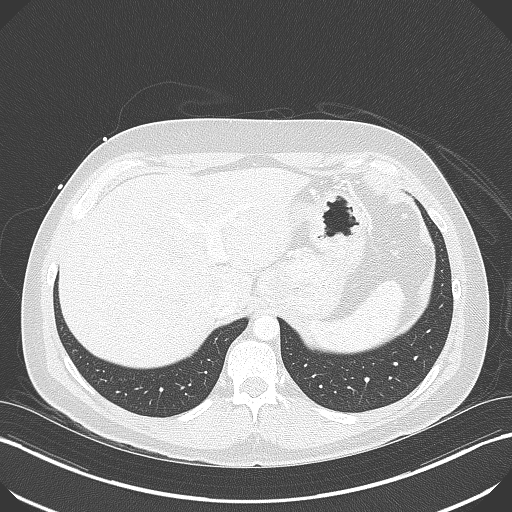
[im 55/163  lung]
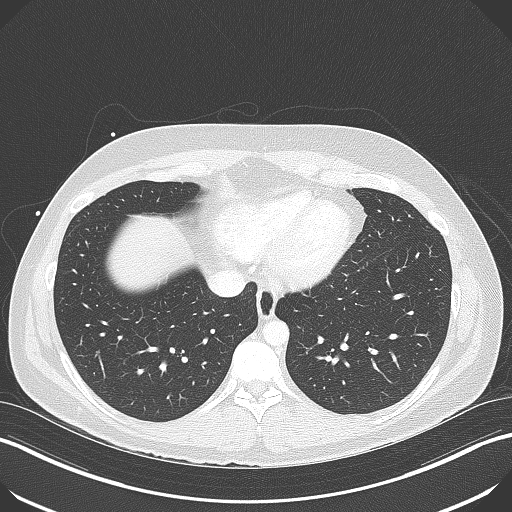
[im 76/163  lung]
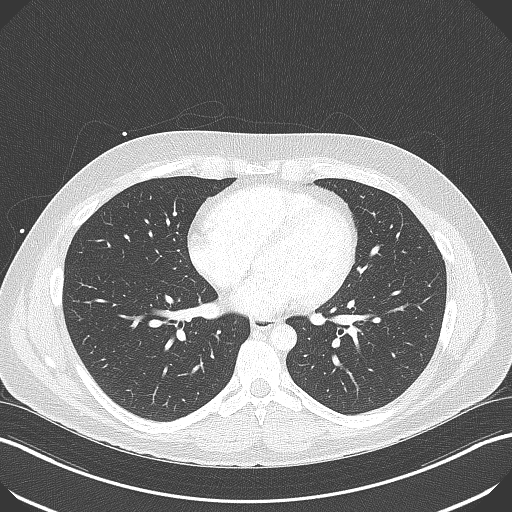
[im 87/163  lung]
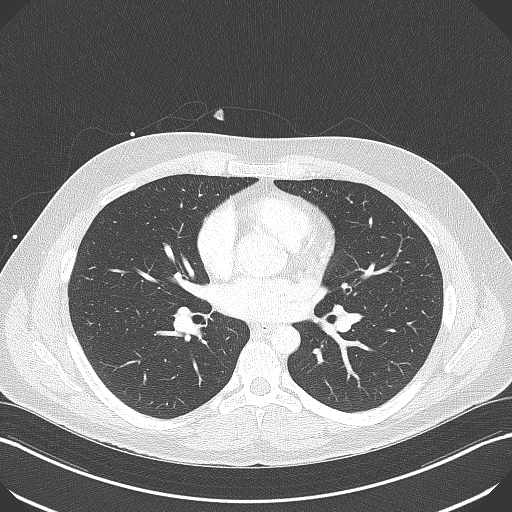
[im 109/163  lung]
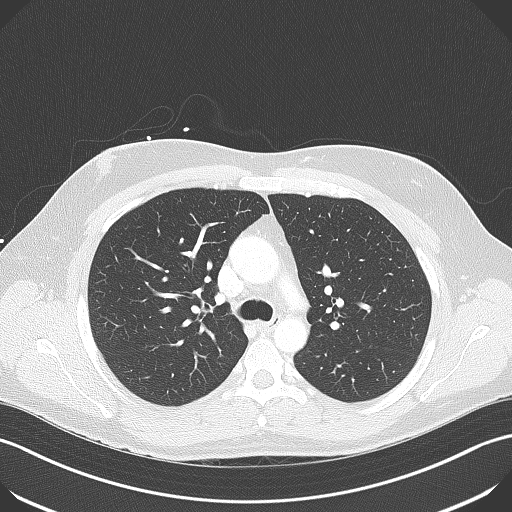

[Series 5: coronals · coronal · 0.77mm/px · 3 of 132 slices shown]
[im 27/132  lung]
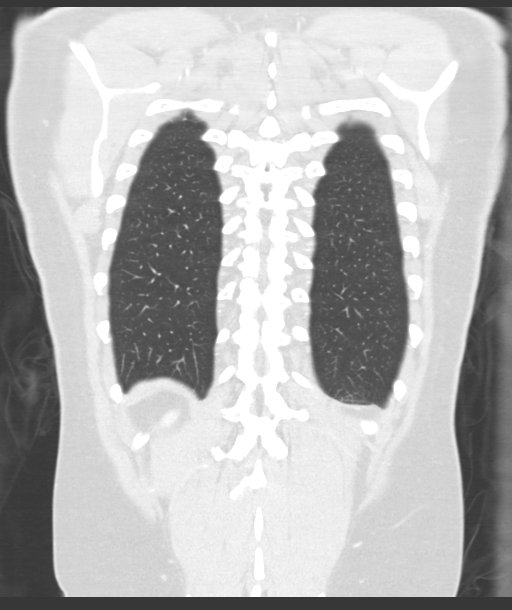
[im 53/132  lung]
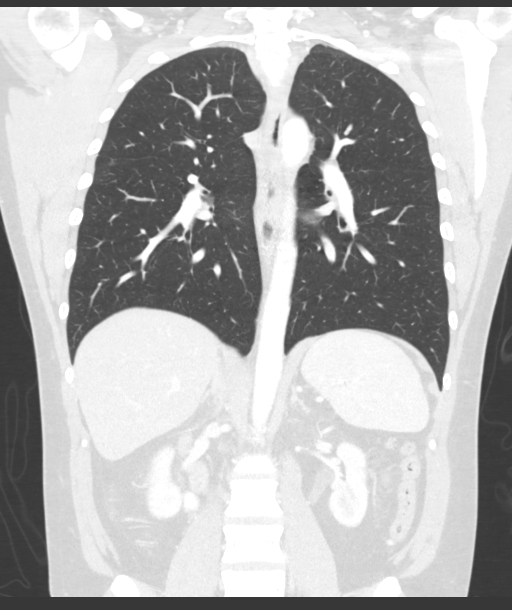
[im 79/132  lung]
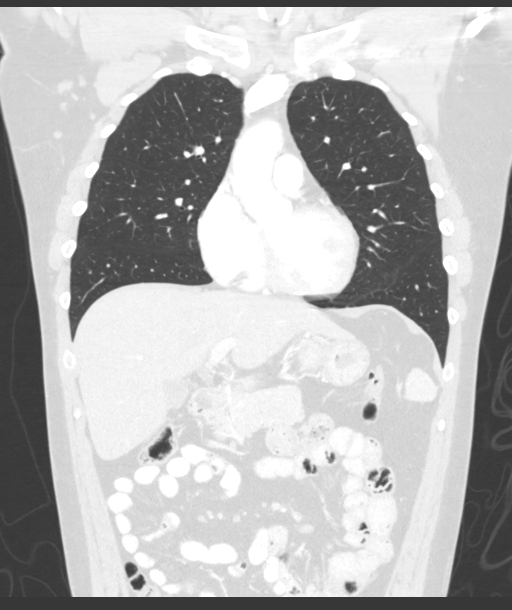

[16 of 36 positions shown; findings below may reference images not displayed]

FINDINGS: CT CHEST FINDINGS

Cardiovascular: Unremarkable

Mediastinum/Nodes: Unremarkable. No esophageal abnormality observed.
No intramural hematoma along the esophagus is observed. No
pneumomediastinum.

Lungs/Pleura: Unremarkable

Musculoskeletal: Mild gynecomastia. No significant bony abnormality
identified.

CT ABDOMEN FINDINGS

Hepatobiliary: Unremarkable

Pancreas: Unremarkable

Spleen: Unremarkable

Adrenals/Urinary Tract: Unremarkable

Stomach/Bowel: A fundoplication wrap is observed extending around
the gastroesophageal junction with a metal clip along the margin of
the wrap. No findings of abscess or abnormal fluid collection in
this vicinity; no observed extraluminal gas. Most of the stomach is
relatively nondistended, no additional gastric findings are observed
on today's CT. No dilated upper abdominal bowel.

Vascular/Lymphatic: Unremarkable

Other: No supplemental non-categorized findings.

Musculoskeletal: Unremarkable
IMPRESSION: 1. A fully intra-abdominal fundoplication wrap is observed extending
around the gastroesophageal junction. No obvious abnormal
extraluminal gas or fluid is identified; an obvious site of bleeding
in this region is not observed. No obvious extraluminal gas,
abscess, or intramural hematoma. If there is high clinical suspicion
for Wirmvem tear, endoscopy may be warranted.

## 2022-04-22 IMAGING — CT CT ABD-PELV W/ CM
2 of 4 series · 16 of 46 positions shown, 18 images · IV contrast (Omnipaque)
Comparison: CT 09/01/2021

CLINICAL DATA: Peritonitis or perforation suspected. Vomiting
coffee ground emesis

EXAM:
CT ABDOMEN AND PELVIS WITH CONTRAST
TECHNIQUE: Multidetector CT imaging of the abdomen and pelvis was performed
using the standard protocol following bolus administration of
intravenous contrast.
CONTRAST:  100mL OMNIPAQUE IOHEXOL 300 MG/ML  SOLN

[Series 2: axial st · axial · 0.89mm/px · z∈[-541,-76]mm · 13 of 103 slices shown, 15 images]
[im 5/103  soft-tissue]
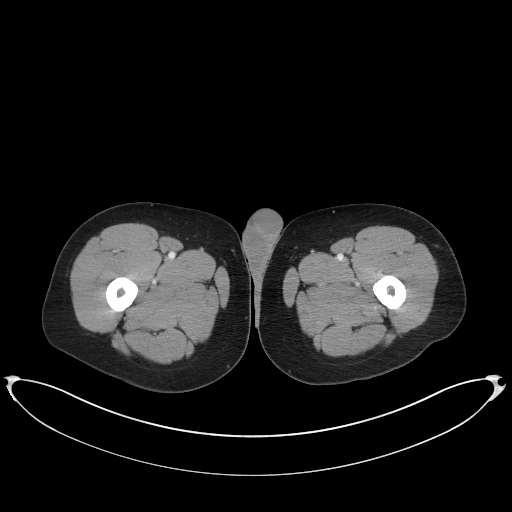
[im 5/103  bone]
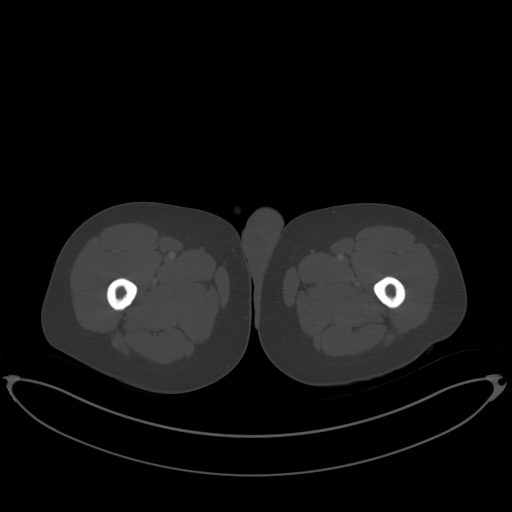
[im 13/103  soft-tissue]
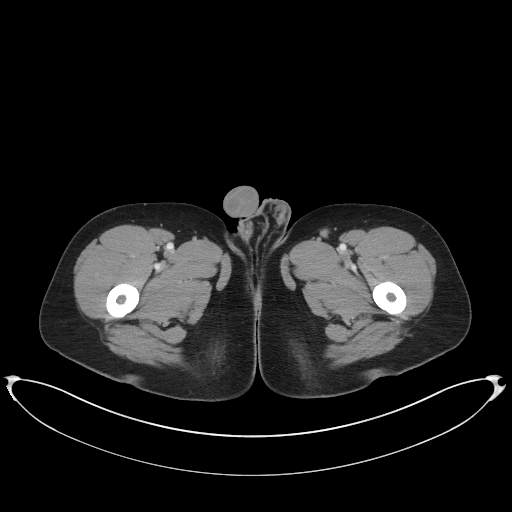
[im 21/103  soft-tissue]
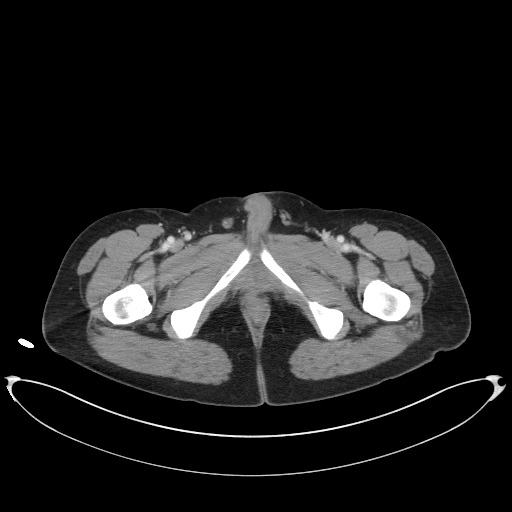
[im 29/103  soft-tissue]
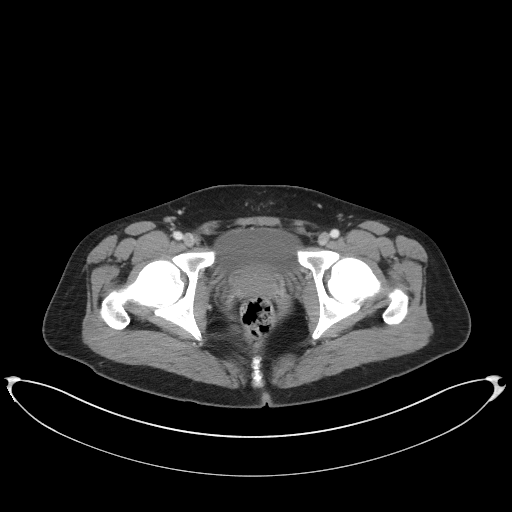
[im 37/103  soft-tissue]
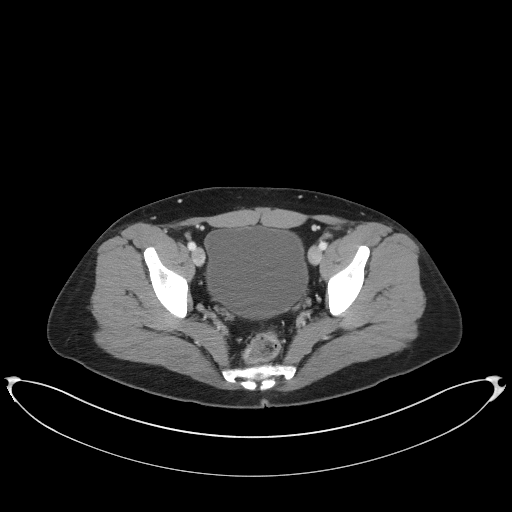
[im 45/103  soft-tissue]
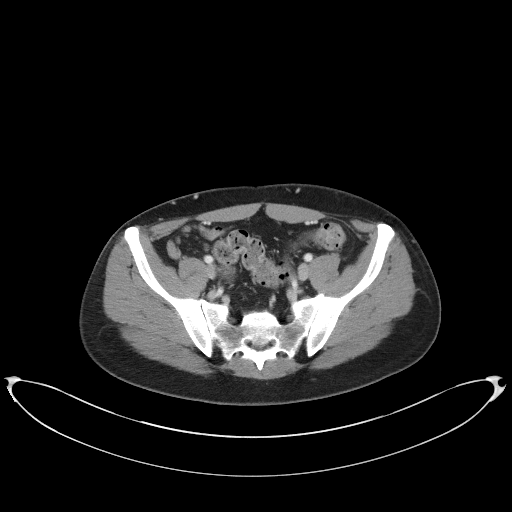
[im 54/103  soft-tissue]
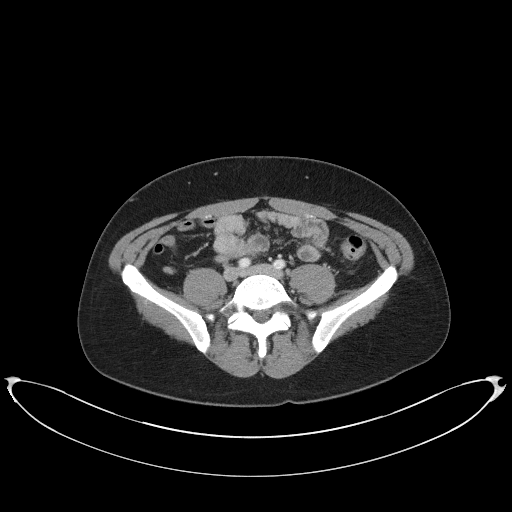
[im 58/103  soft-tissue]
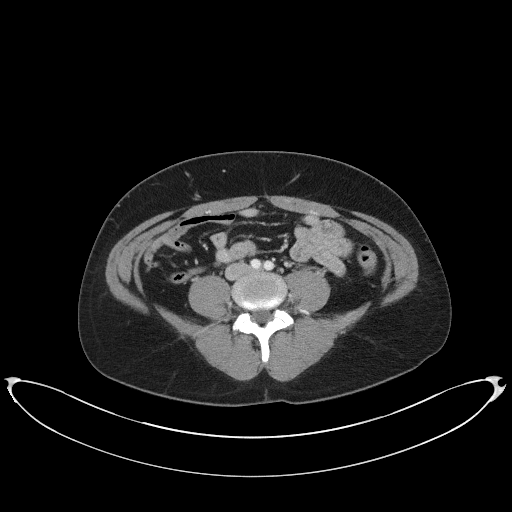
[im 66/103  soft-tissue]
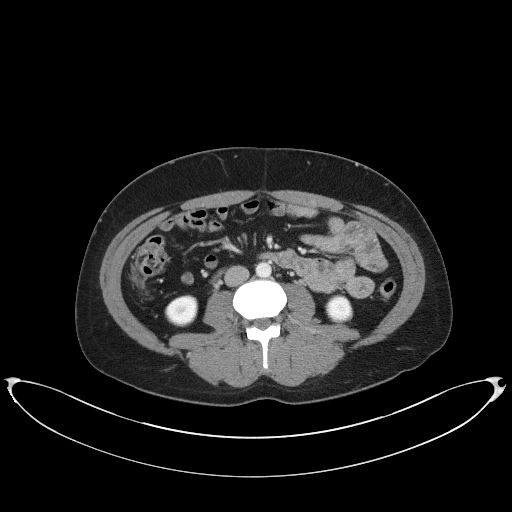
[im 66/103  bone]
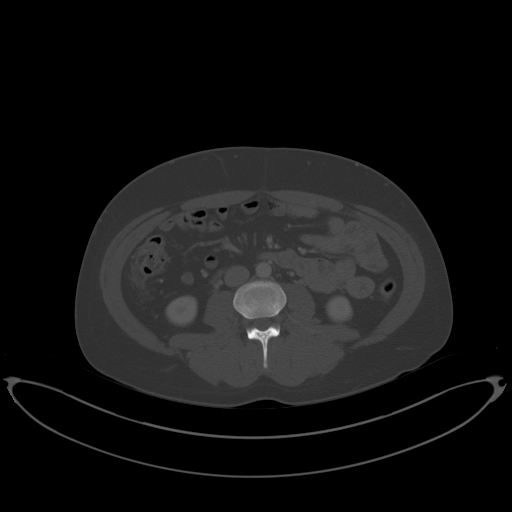
[im 74/103  soft-tissue]
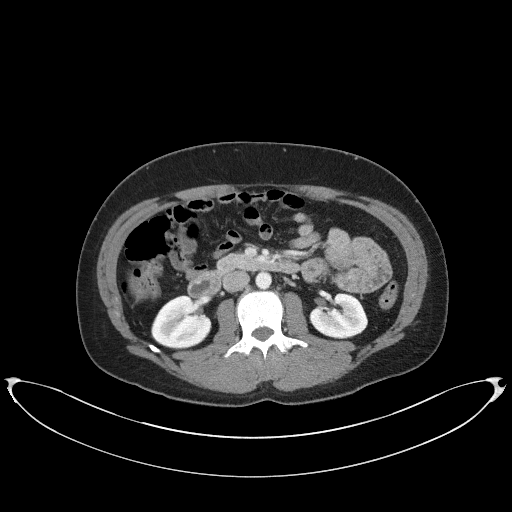
[im 82/103  soft-tissue]
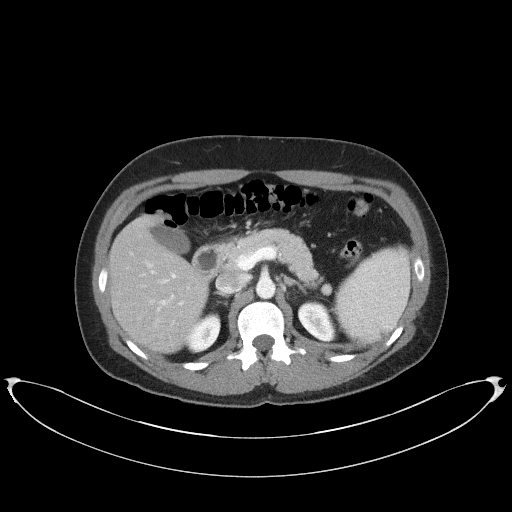
[im 90/103  soft-tissue]
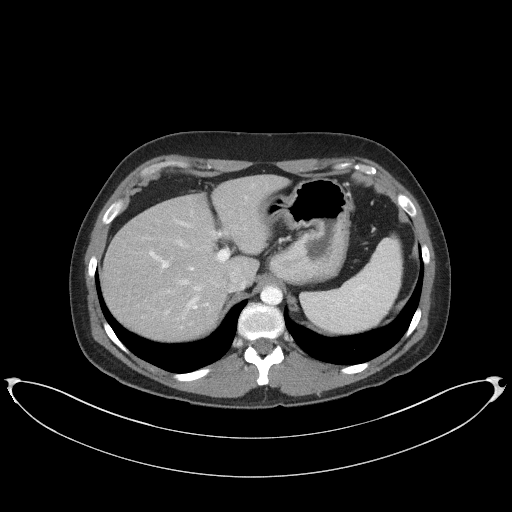
[im 98/103  soft-tissue]
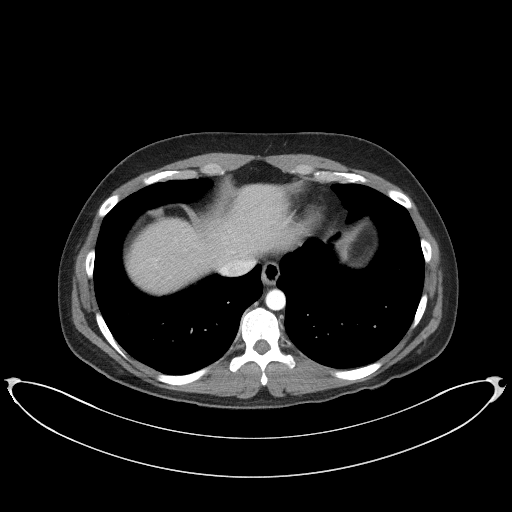

[Series 5: coronal st · coronal · 0.78mm/px · 3 of 83 slices shown]
[im 28/83  soft-tissue]
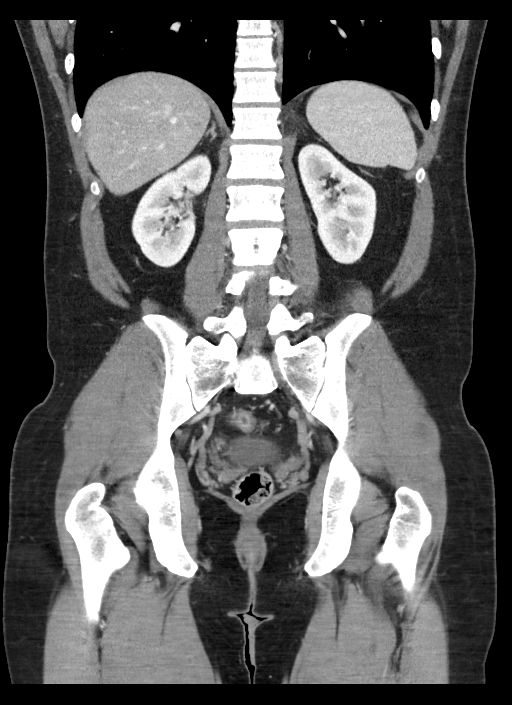
[im 37/83  soft-tissue]
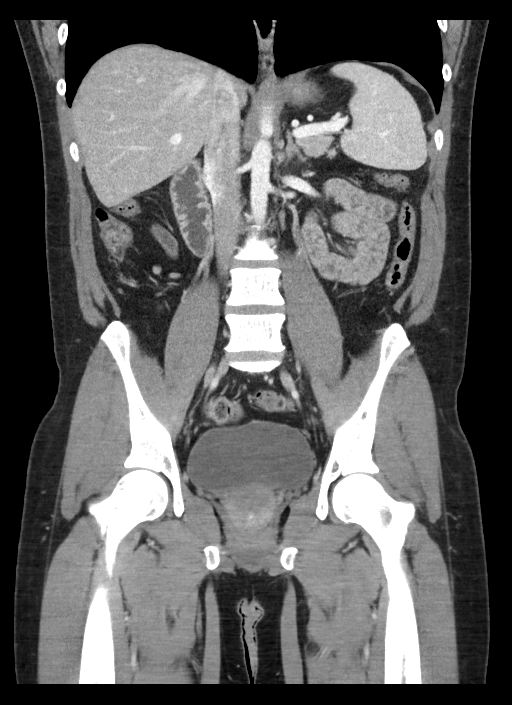
[im 46/83  soft-tissue]
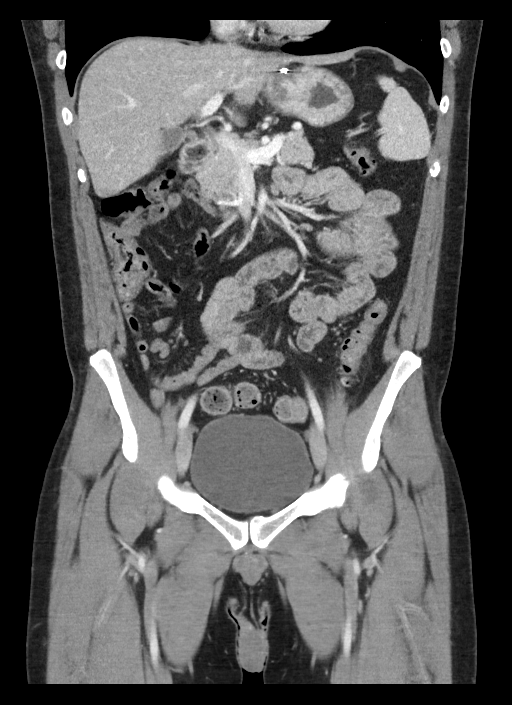

[16 of 46 positions shown; findings below may reference images not displayed]

FINDINGS: Lower chest: Lung bases are clear.

Hepatobiliary: No focal hepatic lesion. No biliary duct dilatation.
Common bile duct is normal.

Pancreas: Pancreas is normal. No ductal dilatation. No pancreatic
inflammation.

Spleen: Normal spleen

Adrenals/urinary tract: Adrenal glands and kidneys are normal. The
ureters and bladder normal.

Stomach/Bowel: Postsurgical change at the GE junction consistent
with fundoplication. Distal esophagus appears normal. Stomach
appears normal. No debris within the stomach. No mucosal
inflammation identified on CT imaging. Duodenum is normal. Small
bowel and terminal ileum normal. Appendix normal.

There is very mild inflammation in the pericolonic fat along the
descending colon (image 53/2 and image 54/2). Several diverticula
through this region. Rectosigmoid colon normal.

Vascular/Lymphatic: Abdominal aorta is normal caliber. No periportal
or retroperitoneal adenopathy. No pelvic adenopathy.

Reproductive: Prostate normal

Other: No intraperitoneal free air.  No intraperitoneal free fluid

Musculoskeletal: No aggressive osseous lesion.
IMPRESSION: 1. Nissen fundoplication wrap without evidence of complication.
2. No perforation or abscess.  No intraperitoneal free air or fluid.
3. Very mild pericolonic inflammation along the descending colon
suggest mild acute diverticulitis.

## 2022-10-12 ENCOUNTER — Emergency Department (HOSPITAL_BASED_OUTPATIENT_CLINIC_OR_DEPARTMENT_OTHER)
Admission: EM | Admit: 2022-10-12 | Discharge: 2022-10-12 | Disposition: A | Discharge status: Home / Self Care | Payer: Self-pay | Attending: Emergency Medicine | Admitting: Emergency Medicine

## 2022-10-12 ENCOUNTER — Encounter (HOSPITAL_BASED_OUTPATIENT_CLINIC_OR_DEPARTMENT_OTHER): Payer: Self-pay

## 2022-10-12 ENCOUNTER — Other Ambulatory Visit: Payer: Self-pay

## 2022-10-12 ENCOUNTER — Emergency Department (HOSPITAL_BASED_OUTPATIENT_CLINIC_OR_DEPARTMENT_OTHER): Payer: Self-pay

## 2022-10-12 DIAGNOSIS — R61 Generalized hyperhidrosis: Secondary | ICD-10-CM | POA: Insufficient documentation

## 2022-10-12 DIAGNOSIS — R202 Paresthesia of skin: Secondary | ICD-10-CM | POA: Insufficient documentation

## 2022-10-12 DIAGNOSIS — R200 Anesthesia of skin: Secondary | ICD-10-CM | POA: Insufficient documentation

## 2022-10-12 DIAGNOSIS — R072 Precordial pain: Secondary | ICD-10-CM | POA: Insufficient documentation

## 2022-10-12 DIAGNOSIS — R110 Nausea: Secondary | ICD-10-CM | POA: Insufficient documentation

## 2022-10-12 LAB — CBC
HCT: 42.8 % (ref 39.0–52.0)
Hemoglobin: 15.1 g/dL (ref 13.0–17.0)
MCH: 30.9 pg (ref 26.0–34.0)
MCHC: 35.3 g/dL (ref 30.0–36.0)
MCV: 87.5 fL (ref 80.0–100.0)
Platelets: 259 10*3/uL (ref 150–400)
RBC: 4.89 MIL/uL (ref 4.22–5.81)
RDW: 12.6 % (ref 11.5–15.5)
WBC: 5.7 10*3/uL (ref 4.0–10.5)
nRBC: 0 % (ref 0.0–0.2)

## 2022-10-12 LAB — BASIC METABOLIC PANEL
Anion gap: 9 (ref 5–15)
BUN: 15 mg/dL (ref 6–20)
CO2: 26 mmol/L (ref 22–32)
Calcium: 9.3 mg/dL (ref 8.9–10.3)
Chloride: 107 mmol/L (ref 98–111)
Creatinine, Ser: 1.04 mg/dL (ref 0.61–1.24)
GFR, Estimated: >60 mL/min (ref >60)
Glucose, Bld: 88 mg/dL (ref 70–99)
Potassium: 4.5 mmol/L (ref 3.5–5.1)
Sodium: 142 mmol/L (ref 135–145)

## 2022-10-12 LAB — TROPONIN I (HIGH SENSITIVITY)
Troponin I (High Sensitivity): 3 ng/L (ref <18)
Troponin I (High Sensitivity): 2 ng/L (ref <18)

## 2022-10-12 MED ORDER — ONDANSETRON HCL 4 MG/2ML IJ SOLN
4.0000 mg | Freq: Once | INTRAMUSCULAR | Status: AC
  Administered 2022-10-12: 4 mg via INTRAVENOUS
  Filled 2022-10-12: qty 2

## 2022-10-12 MED ORDER — ALUM & MAG HYDROXIDE-SIMETH 200-200-20 MG/5ML PO SUSP
30.0000 mL | Freq: Once | ORAL | Status: AC
  Administered 2022-10-12: 30 mL via ORAL
  Filled 2022-10-12: qty 30

## 2022-10-12 NOTE — ED Provider Notes (Signed)
[facility] EMERGENCY DEPARTMENT Provider Note   CSN: [id number] Arrival date & time: 10/12/22  1023     History  Chief Complaint  Patient presents with   Chest Pain    Tanner Wallace is a 36 y.o. male.  Patient with history of GERD and anxiety presents today with complaints of chest pain. He states that same began suddenly approximately 1 hour prior to arrival. Same is substernal in nature. States that he has had associated nausea and diaphoresis. States that he has had similar symptoms previously but has never been evaluated given symptoms have never been this severe. He states that soon after his pain began he felt numbness/tingling in his bilateral arms. Hx of laparoscopic hiatal hernia repair in 2021 without complication, patient states that he has not had GERD symptoms since this was repaired. Denies fevers, chills, cough, congestion, vomiting, or diarrhea. Patient denies any cardiac hx, he does not smoke, and denies any family history of heart problems or sudden death at a young age.   The history is provided by the patient. No language interpreter was used.  Chest Pain      Home Medications Prior to Admission medications   Medication Sig Start Date End Date Taking? Authorizing Provider  ALPRAZolam Prudy Feeler) 0.5 MG tablet Take 1 tablet (0.5 mg total) by mouth 2 (two) times daily as needed for anxiety. 01/01/17  Yes Waldon Merl, PA-C  famotidine (PEPCID) 20 MG tablet Take 1 tablet (20 mg total) by mouth 2 (two) times daily. 10/22/21  Yes Kommor, Madison, MD  FLUoxetine (PROZAC) 20 MG tablet Take 1 tablet (20 mg total) by mouth daily. 06/03/15  Yes Sheliah Hatch, MD  celecoxib (CELEBREX) 100 MG capsule Take 1 capsule (100 mg total) by mouth daily. 12/16/16   Sheliah Hatch, MD  cyclobenzaprine (FLEXERIL) 10 MG tablet Take 1 tablet (10 mg total) by mouth at bedtime. 07/04/15   Sheliah Hatch, MD  dicyclomine (BENTYL) 20 MG tablet Take 1 tablet (20 mg  total) by mouth 2 (two) times daily. 01/31/22   Theron Arista, PA-C  hydrOXYzine (ATARAX) 25 MG tablet Take 1 tablet (25 mg total) by mouth every 6 (six) hours. 10/22/21   Kommor, Madison, MD  ondansetron (ZOFRAN ODT) 4 MG disintegrating tablet Take 1 tablet (4 mg total) by mouth every 8 (eight) hours as needed for nausea or vomiting. 09/01/21   Kommor, Madison, MD  ondansetron (ZOFRAN) 4 MG tablet Take 1 tablet (4 mg total) by mouth every 6 (six) hours. 09/23/21   Curatolo, Adam, DO  sucralfate (CARAFATE) 1 g tablet Take 1 tablet (1 g total) by mouth 4 (four) times daily -  with meals and at bedtime for 14 days. 09/23/21 10/07/21  Curatolo, Adam, DO  traZODone (DESYREL) 50 MG tablet Take 0.5-1 tablets (25-50 mg total) by mouth at bedtime as needed for sleep. 06/02/16   Waldon Merl, PA-C      Allergies    Patient has no known allergies.    Review of Systems   Review of Systems  Cardiovascular:  Positive for chest pain.  All other systems reviewed and are negative.   Physical Exam Updated Vital Signs BP (!) 150/107   Pulse 78   Temp 97.8 F (36.6 C) (Oral)   Resp (!) 8   Ht 5\' 6"  (1.676 m)   Wt 68 kg   SpO2 100%   BMI 24.21 kg/m  Physical Exam Vitals and nursing note reviewed.  Constitutional:  General: He is not in acute distress.    Appearance: Normal appearance. He is well-developed and normal weight. He is not ill-appearing, toxic-appearing or diaphoretic.  HENT:     Head: Normocephalic and atraumatic.  Cardiovascular:     Rate and Rhythm: Normal rate and regular rhythm.     Pulses:          Radial pulses are 2+ on the right side and 2+ on the left side.     Heart sounds: Normal heart sounds.  Pulmonary:     Effort: Pulmonary effort is normal. No respiratory distress.     Breath sounds: Normal breath sounds.  Abdominal:     Palpations: Abdomen is soft.  Musculoskeletal:        General: Normal range of motion.     Cervical back: Normal range of motion.     Right  lower leg: No tenderness. No edema.     Left lower leg: No tenderness. No edema.  Skin:    General: Skin is warm and dry.  Neurological:     General: No focal deficit present.     Mental Status: He is alert.  Psychiatric:        Mood and Affect: Mood normal.        Behavior: Behavior normal.     ED Results / Procedures / Treatments   Labs (all labs ordered are listed, but only abnormal results are displayed) Labs Reviewed  BASIC METABOLIC PANEL  CBC  TROPONIN I (HIGH SENSITIVITY)  TROPONIN I (HIGH SENSITIVITY)    EKG EKG Interpretation  Date/Time:  Monday October 12 2022 10:32:19 EST Ventricular Rate:  81 PR Interval:  135 QRS Duration: 94 QT Interval:  363 QTC Calculation: 422 R Axis:   60 Text Interpretation: Sinus rhythm No significant change since prior 3/23 Confirmed by Meridee Score [phone]) on 10/12/2022 10:35:34 AM  Radiology DG Chest 2 View  Result Date: 10/12/2022 CLINICAL DATA:  Chest pain. EXAM: CHEST - 2 VIEW COMPARISON:  Radiographs 05/28/2021.  CT 09/01/2021. FINDINGS: 1118 hours. The heart size and mediastinal contours are normal. The lungs are clear. There is no pleural effusion or pneumothorax. No acute osseous findings are identified. Postsurgical changes are noted at the GE junction. Telemetry leads overlie the chest. IMPRESSION: No active cardiopulmonary process. Electronically Signed   By: Carey Bullocks M.D.   On: 10/12/2022 11:06    Procedures Procedures    Medications Ordered in ED Medications  alum & mag hydroxide-simeth (MAALOX/MYLANTA) 200-200-20 MG/5ML suspension 30 mL (30 mLs Oral Given 10/12/22 1144)  ondansetron (ZOFRAN) injection 4 mg (4 mg Intravenous Given 10/12/22 1309)    ED Course/ Medical Decision Making/ A&P                           Medical Decision Making Amount and/or Complexity of Data Reviewed Labs: ordered. Radiology: ordered.   This patient is a 36 y.o. male who presents to the ED for concern of chest pain,  this involves an extensive number of treatment options, and is a complaint that carries with it a high risk of complications and morbidity. The emergent differential diagnosis prior to evaluation includes, but is not limited to,  ACS, pericarditis, aortic dissection, PE, pneumothorax, esophageal spasm or rupture, chronic angina, valvular disease, cardiomyopathy, myocarditis, pulmonary HTN, pneumonia, bronchitis, GERD, reflux/PUD, biliary disease, pancreatitis, disk disease, costochondritis, anxiety or panic attack  This is not an exhaustive differential.   Past Medical History /  Co-morbidities / Social History: Hx hiatal hernia repair in 2021. Patient saw cardiology in 2022 after ER referral for similar symptoms. Wore a holter monitor and had unremarkable work-up at that time.  Physical Exam: Physical exam performed. The pertinent findings include: no acute findings  Lab Tests: I ordered, and personally interpreted labs.  The pertinent results include:  no leukocytosis or anemia, troponin negative and flat. No other acute laboratory findings   Imaging Studies: I ordered imaging studies including CXR. I independently visualized and interpreted imaging which showed NAD. I agree with the radiologist interpretation.   Cardiac Monitoring:  The patient was maintained on a cardiac monitor.  My attending physician Dr. Charm Barges viewed and interpreted the cardiac monitored which showed an underlying rhythm of: no STEMI. I agree with this interpretation.   Medications: I ordered medication including GI cocktail and zofran  for pain and nausea. Reevaluation of the patient after these medicines showed that the patient improved. I have reviewed the patients home medicines and have made adjustments as needed.    Disposition:   Patient presents today with complaints of chest pain x 1 hour. He is afebrile, nontoxic-appearing, and in no acute distress with reassuring vital signs.  After evaluating all of the  data points in this case, the presentation of CLEMMIE MARXEN is NOT consistent with Acute Coronary Syndrome (ACS) and/or myocardial ischemia, pulmonary embolism (PERC negative), aortic dissection; Borhaave's, significant arrythmia, pneumothorax, cardiac tamponade, or other emergent cardiopulmonary condition.  Further, the presentation of JARRICK FJELD is NOT consistent with pericarditis, myocarditis, mediastinitis, endocarditis, new valvular disease.  Additionally, the presentation of KIYON FIDALGO NOT consistent with flail chest, cardiac contusion, ARDS, or significant intra-thoracic bleeding  Moreover, this presentation is NOT consistent with pneumonia or sepsis  The patient has a   Heart score 0, low risk. He is also PERC negative. Pain improved after medication management. Patient states he already has reflux and nausea medication at home that he has not been taking. Suspect reflux is contributing factor to pain. Patient also has history of anxiety and states he has been under more stress recently. Suspect this is playing a role as well. Will recommend restarting his home medication. Given symptoms have improved, patient is stable for discharge. Will recommend that patient follow-up with his cardiologist for continued evaluation and management of symptoms.  Patient is understanding and amenable with plan, educated on red flag symptoms that would prompt immediate return.  Patient discharged in stable condition.   Strict return and follow-up precautions have been given by me personally or by detailed written instruction given verbally by nursing staff using the teach back method to the patient/family/caregiver(s).  Data Reviewed/Counseling: I have reviewed the patient's vital signs, nursing notes, and other relevant tests/information. I had a detailed discussion regarding the historical points, exam findings, and any diagnostic results supporting the discharge diagnosis. I also discussed the need  for outpatient follow-up and the need to return to the ED if symptoms worsen or if there are any questions or concerns that arise at home.   Final Clinical Impression(s) / ED Diagnoses Final diagnoses:  Precordial chest pain    Rx / DC Orders ED Discharge Orders     None     An After Visit Summary was printed and given to the patient.     Vear Clock 10/12/22 1512    Terrilee Files, MD 10/13/22 [phone]

## 2022-10-12 NOTE — ED Triage Notes (Addendum)
Pt presents with complaints of chest pain approx 1 hour ago. Reports arms are numb and nausea and vomiting . Stabbing pain in mid chest. Pt has hx of espohageal sx due to reflux > 3 years ago

## 2022-10-12 NOTE — Discharge Instructions (Addendum)
As we discussed, your work-up in the ER today was reassuring for acute findings.  Laboratory evaluation, x-ray imaging, and EKG did not reveal any emergent concerns today.  I recommend that you take your home meds for any recurring symptoms and call your cardiologist to schedule appointment for continued evaluation and management of your symptoms.  Return if development of any new or worsening symptoms.
